# Patient Record
Sex: Male | Born: 1965 | Race: White | Hispanic: No | Marital: Single | Smoking: Former smoker
Health system: Southern US, Community
[De-identification: ages and names within clinical notes are randomized; demographics above are authoritative.]

## PROBLEM LIST (undated history)

## (undated) DIAGNOSIS — G4730 Sleep apnea, unspecified: Secondary | ICD-10-CM

## (undated) DIAGNOSIS — R748 Abnormal levels of other serum enzymes: Secondary | ICD-10-CM

## (undated) DIAGNOSIS — Z860100 Personal history of colon polyps, unspecified: Secondary | ICD-10-CM

## (undated) DIAGNOSIS — J302 Other seasonal allergic rhinitis: Secondary | ICD-10-CM

## (undated) DIAGNOSIS — Z87442 Personal history of urinary calculi: Secondary | ICD-10-CM

## (undated) DIAGNOSIS — K219 Gastro-esophageal reflux disease without esophagitis: Secondary | ICD-10-CM

## (undated) DIAGNOSIS — E785 Hyperlipidemia, unspecified: Secondary | ICD-10-CM

## (undated) DIAGNOSIS — G8929 Other chronic pain: Secondary | ICD-10-CM

## (undated) DIAGNOSIS — N529 Male erectile dysfunction, unspecified: Secondary | ICD-10-CM

## (undated) DIAGNOSIS — T7840XA Allergy, unspecified, initial encounter: Secondary | ICD-10-CM

## (undated) DIAGNOSIS — M109 Gout, unspecified: Secondary | ICD-10-CM

## (undated) DIAGNOSIS — K760 Fatty (change of) liver, not elsewhere classified: Secondary | ICD-10-CM

## (undated) DIAGNOSIS — F419 Anxiety disorder, unspecified: Secondary | ICD-10-CM

## (undated) DIAGNOSIS — I10 Essential (primary) hypertension: Secondary | ICD-10-CM

## (undated) DIAGNOSIS — R519 Headache, unspecified: Secondary | ICD-10-CM

## (undated) DIAGNOSIS — R51 Headache: Secondary | ICD-10-CM

## (undated) DIAGNOSIS — K5792 Diverticulitis of intestine, part unspecified, without perforation or abscess without bleeding: Secondary | ICD-10-CM

## (undated) HISTORY — PX: COLONOSCOPY: SHX174

## (undated) HISTORY — DX: Allergy, unspecified, initial encounter: T78.40XA

## (undated) HISTORY — PX: POLYPECTOMY: SHX149

## (undated) HISTORY — DX: Abnormal levels of other serum enzymes: R74.8

## (undated) HISTORY — DX: Personal history of colonic polyps: Z86.010

## (undated) HISTORY — DX: Male erectile dysfunction, unspecified: N52.9

## (undated) HISTORY — DX: Essential (primary) hypertension: I10

## (undated) HISTORY — DX: Fatty (change of) liver, not elsewhere classified: K76.0

## (undated) HISTORY — DX: Headache: R51

## (undated) HISTORY — DX: Headache, unspecified: R51.9

## (undated) HISTORY — DX: Anxiety disorder, unspecified: F41.9

## (undated) HISTORY — DX: Personal history of colon polyps, unspecified: Z86.0100

## (undated) HISTORY — DX: Other chronic pain: G89.29

## (undated) HISTORY — DX: Other seasonal allergic rhinitis: J30.2

## (undated) HISTORY — PX: HERNIA REPAIR: SHX51

## (undated) HISTORY — PX: OTHER SURGICAL HISTORY: SHX169

## (undated) HISTORY — DX: Hyperlipidemia, unspecified: E78.5

## (undated) HISTORY — DX: Gastro-esophageal reflux disease without esophagitis: K21.9

## (undated) HISTORY — DX: Gout, unspecified: M10.9

## (undated) HISTORY — DX: Sleep apnea, unspecified: G47.30

## (undated) HISTORY — PX: UPPER GASTROINTESTINAL ENDOSCOPY: SHX188

## (undated) HISTORY — DX: Diverticulitis of intestine, part unspecified, without perforation or abscess without bleeding: K57.92

---

## 2012-04-11 ENCOUNTER — Ambulatory Visit (INDEPENDENT_AMBULATORY_CARE_PROVIDER_SITE_OTHER): Payer: 59 | Admitting: Licensed Clinical Social Worker

## 2012-04-11 DIAGNOSIS — F39 Unspecified mood [affective] disorder: Secondary | ICD-10-CM

## 2012-04-18 ENCOUNTER — Ambulatory Visit (INDEPENDENT_AMBULATORY_CARE_PROVIDER_SITE_OTHER): Payer: 59 | Admitting: Licensed Clinical Social Worker

## 2012-04-18 DIAGNOSIS — F39 Unspecified mood [affective] disorder: Secondary | ICD-10-CM

## 2012-04-25 ENCOUNTER — Ambulatory Visit (INDEPENDENT_AMBULATORY_CARE_PROVIDER_SITE_OTHER): Payer: 59 | Admitting: Licensed Clinical Social Worker

## 2012-04-25 DIAGNOSIS — F39 Unspecified mood [affective] disorder: Secondary | ICD-10-CM

## 2012-04-29 ENCOUNTER — Ambulatory Visit: Payer: 59 | Admitting: Licensed Clinical Social Worker

## 2012-05-11 ENCOUNTER — Ambulatory Visit: Payer: 59 | Admitting: Licensed Clinical Social Worker

## 2012-09-21 ENCOUNTER — Ambulatory Visit (INDEPENDENT_AMBULATORY_CARE_PROVIDER_SITE_OTHER): Payer: 59 | Admitting: Licensed Clinical Social Worker

## 2012-09-21 DIAGNOSIS — F39 Unspecified mood [affective] disorder: Secondary | ICD-10-CM

## 2012-10-05 ENCOUNTER — Ambulatory Visit (INDEPENDENT_AMBULATORY_CARE_PROVIDER_SITE_OTHER): Payer: 59 | Admitting: Licensed Clinical Social Worker

## 2012-10-05 DIAGNOSIS — F39 Unspecified mood [affective] disorder: Secondary | ICD-10-CM

## 2012-10-17 ENCOUNTER — Ambulatory Visit (INDEPENDENT_AMBULATORY_CARE_PROVIDER_SITE_OTHER): Payer: 59 | Admitting: Licensed Clinical Social Worker

## 2012-10-17 DIAGNOSIS — F39 Unspecified mood [affective] disorder: Secondary | ICD-10-CM

## 2012-11-02 ENCOUNTER — Ambulatory Visit (INDEPENDENT_AMBULATORY_CARE_PROVIDER_SITE_OTHER): Payer: 59 | Admitting: Licensed Clinical Social Worker

## 2012-11-02 DIAGNOSIS — F39 Unspecified mood [affective] disorder: Secondary | ICD-10-CM

## 2012-11-14 ENCOUNTER — Ambulatory Visit (INDEPENDENT_AMBULATORY_CARE_PROVIDER_SITE_OTHER): Payer: 59 | Admitting: Licensed Clinical Social Worker

## 2012-11-14 DIAGNOSIS — F39 Unspecified mood [affective] disorder: Secondary | ICD-10-CM

## 2012-12-05 ENCOUNTER — Ambulatory Visit (INDEPENDENT_AMBULATORY_CARE_PROVIDER_SITE_OTHER): Payer: 59 | Admitting: Licensed Clinical Social Worker

## 2012-12-05 DIAGNOSIS — F39 Unspecified mood [affective] disorder: Secondary | ICD-10-CM

## 2012-12-19 ENCOUNTER — Ambulatory Visit (INDEPENDENT_AMBULATORY_CARE_PROVIDER_SITE_OTHER): Payer: 59 | Admitting: Licensed Clinical Social Worker

## 2012-12-19 DIAGNOSIS — F39 Unspecified mood [affective] disorder: Secondary | ICD-10-CM

## 2013-01-04 ENCOUNTER — Ambulatory Visit (INDEPENDENT_AMBULATORY_CARE_PROVIDER_SITE_OTHER): Payer: 59 | Admitting: Licensed Clinical Social Worker

## 2013-01-04 DIAGNOSIS — F39 Unspecified mood [affective] disorder: Secondary | ICD-10-CM

## 2013-01-25 ENCOUNTER — Ambulatory Visit (INDEPENDENT_AMBULATORY_CARE_PROVIDER_SITE_OTHER): Payer: 59 | Admitting: Licensed Clinical Social Worker

## 2013-01-25 DIAGNOSIS — F39 Unspecified mood [affective] disorder: Secondary | ICD-10-CM

## 2013-06-01 DIAGNOSIS — N529 Male erectile dysfunction, unspecified: Secondary | ICD-10-CM | POA: Insufficient documentation

## 2013-06-01 DIAGNOSIS — I10 Essential (primary) hypertension: Secondary | ICD-10-CM | POA: Insufficient documentation

## 2013-06-01 DIAGNOSIS — E782 Mixed hyperlipidemia: Secondary | ICD-10-CM | POA: Insufficient documentation

## 2013-06-01 DIAGNOSIS — R519 Headache, unspecified: Secondary | ICD-10-CM | POA: Insufficient documentation

## 2013-06-01 DIAGNOSIS — F4322 Adjustment disorder with anxiety: Secondary | ICD-10-CM | POA: Insufficient documentation

## 2013-12-11 ENCOUNTER — Ambulatory Visit (INDEPENDENT_AMBULATORY_CARE_PROVIDER_SITE_OTHER): Payer: 59 | Admitting: Licensed Clinical Social Worker

## 2013-12-11 DIAGNOSIS — F39 Unspecified mood [affective] disorder: Secondary | ICD-10-CM

## 2013-12-13 DIAGNOSIS — J302 Other seasonal allergic rhinitis: Secondary | ICD-10-CM | POA: Insufficient documentation

## 2013-12-27 ENCOUNTER — Ambulatory Visit (INDEPENDENT_AMBULATORY_CARE_PROVIDER_SITE_OTHER): Payer: 59 | Admitting: Licensed Clinical Social Worker

## 2013-12-27 DIAGNOSIS — F329 Major depressive disorder, single episode, unspecified: Secondary | ICD-10-CM

## 2014-01-12 ENCOUNTER — Ambulatory Visit: Payer: 59 | Admitting: Licensed Clinical Social Worker

## 2014-01-17 ENCOUNTER — Ambulatory Visit (INDEPENDENT_AMBULATORY_CARE_PROVIDER_SITE_OTHER): Payer: 59 | Admitting: Licensed Clinical Social Worker

## 2014-01-17 DIAGNOSIS — F329 Major depressive disorder, single episode, unspecified: Secondary | ICD-10-CM

## 2014-01-18 ENCOUNTER — Ambulatory Visit: Payer: 59 | Admitting: Licensed Clinical Social Worker

## 2014-02-09 ENCOUNTER — Ambulatory Visit (INDEPENDENT_AMBULATORY_CARE_PROVIDER_SITE_OTHER): Payer: 59 | Admitting: Licensed Clinical Social Worker

## 2014-02-09 DIAGNOSIS — F329 Major depressive disorder, single episode, unspecified: Secondary | ICD-10-CM

## 2014-03-23 HISTORY — PX: COLONOSCOPY: SHX5424

## 2014-04-11 ENCOUNTER — Ambulatory Visit (INDEPENDENT_AMBULATORY_CARE_PROVIDER_SITE_OTHER): Payer: 59 | Admitting: Licensed Clinical Social Worker

## 2014-04-11 DIAGNOSIS — F329 Major depressive disorder, single episode, unspecified: Secondary | ICD-10-CM

## 2014-04-25 ENCOUNTER — Ambulatory Visit: Payer: 59 | Admitting: Licensed Clinical Social Worker

## 2014-05-11 ENCOUNTER — Ambulatory Visit (INDEPENDENT_AMBULATORY_CARE_PROVIDER_SITE_OTHER): Payer: 59 | Admitting: Licensed Clinical Social Worker

## 2014-05-11 DIAGNOSIS — F329 Major depressive disorder, single episode, unspecified: Secondary | ICD-10-CM

## 2014-05-30 ENCOUNTER — Ambulatory Visit: Payer: 59 | Admitting: Licensed Clinical Social Worker

## 2014-06-15 ENCOUNTER — Ambulatory Visit: Payer: 59 | Admitting: Licensed Clinical Social Worker

## 2014-06-25 ENCOUNTER — Ambulatory Visit (INDEPENDENT_AMBULATORY_CARE_PROVIDER_SITE_OTHER): Payer: 59 | Admitting: Licensed Clinical Social Worker

## 2014-06-25 DIAGNOSIS — F329 Major depressive disorder, single episode, unspecified: Secondary | ICD-10-CM | POA: Diagnosis not present

## 2014-07-09 ENCOUNTER — Ambulatory Visit (INDEPENDENT_AMBULATORY_CARE_PROVIDER_SITE_OTHER): Payer: 59 | Admitting: Licensed Clinical Social Worker

## 2014-07-09 DIAGNOSIS — F329 Major depressive disorder, single episode, unspecified: Secondary | ICD-10-CM

## 2014-07-11 ENCOUNTER — Ambulatory Visit: Payer: 59 | Admitting: Licensed Clinical Social Worker

## 2014-07-23 ENCOUNTER — Ambulatory Visit: Payer: 59 | Admitting: Licensed Clinical Social Worker

## 2014-09-12 ENCOUNTER — Ambulatory Visit (INDEPENDENT_AMBULATORY_CARE_PROVIDER_SITE_OTHER): Payer: 59 | Admitting: Licensed Clinical Social Worker

## 2014-09-12 DIAGNOSIS — F329 Major depressive disorder, single episode, unspecified: Secondary | ICD-10-CM

## 2014-10-10 ENCOUNTER — Ambulatory Visit (INDEPENDENT_AMBULATORY_CARE_PROVIDER_SITE_OTHER): Payer: 59 | Admitting: Licensed Clinical Social Worker

## 2014-10-10 DIAGNOSIS — F329 Major depressive disorder, single episode, unspecified: Secondary | ICD-10-CM | POA: Diagnosis not present

## 2014-10-26 ENCOUNTER — Ambulatory Visit: Payer: 59 | Admitting: Licensed Clinical Social Worker

## 2014-11-21 ENCOUNTER — Ambulatory Visit (INDEPENDENT_AMBULATORY_CARE_PROVIDER_SITE_OTHER): Payer: 59 | Admitting: Licensed Clinical Social Worker

## 2014-11-21 DIAGNOSIS — F329 Major depressive disorder, single episode, unspecified: Secondary | ICD-10-CM

## 2014-12-19 ENCOUNTER — Ambulatory Visit (INDEPENDENT_AMBULATORY_CARE_PROVIDER_SITE_OTHER): Payer: 59 | Admitting: Licensed Clinical Social Worker

## 2014-12-19 DIAGNOSIS — F329 Major depressive disorder, single episode, unspecified: Secondary | ICD-10-CM | POA: Diagnosis not present

## 2014-12-24 DIAGNOSIS — I872 Venous insufficiency (chronic) (peripheral): Secondary | ICD-10-CM | POA: Insufficient documentation

## 2014-12-27 DIAGNOSIS — R748 Abnormal levels of other serum enzymes: Secondary | ICD-10-CM | POA: Insufficient documentation

## 2015-01-14 ENCOUNTER — Ambulatory Visit (INDEPENDENT_AMBULATORY_CARE_PROVIDER_SITE_OTHER): Payer: 59 | Admitting: Licensed Clinical Social Worker

## 2015-01-14 DIAGNOSIS — F329 Major depressive disorder, single episode, unspecified: Secondary | ICD-10-CM

## 2015-06-17 DIAGNOSIS — G479 Sleep disorder, unspecified: Secondary | ICD-10-CM | POA: Insufficient documentation

## 2015-06-17 DIAGNOSIS — R454 Irritability and anger: Secondary | ICD-10-CM | POA: Insufficient documentation

## 2015-07-01 ENCOUNTER — Ambulatory Visit (INDEPENDENT_AMBULATORY_CARE_PROVIDER_SITE_OTHER): Payer: 59 | Admitting: Licensed Clinical Social Worker

## 2015-07-01 DIAGNOSIS — F329 Major depressive disorder, single episode, unspecified: Secondary | ICD-10-CM

## 2015-07-19 ENCOUNTER — Ambulatory Visit (INDEPENDENT_AMBULATORY_CARE_PROVIDER_SITE_OTHER): Payer: 59 | Admitting: Licensed Clinical Social Worker

## 2015-08-14 ENCOUNTER — Ambulatory Visit: Payer: 59 | Admitting: Licensed Clinical Social Worker

## 2015-08-20 ENCOUNTER — Ambulatory Visit (INDEPENDENT_AMBULATORY_CARE_PROVIDER_SITE_OTHER): Payer: 59 | Admitting: Licensed Clinical Social Worker

## 2015-08-20 DIAGNOSIS — F329 Major depressive disorder, single episode, unspecified: Secondary | ICD-10-CM | POA: Diagnosis not present

## 2015-12-04 ENCOUNTER — Encounter: Payer: Self-pay | Admitting: Adult Health

## 2015-12-04 ENCOUNTER — Ambulatory Visit (INDEPENDENT_AMBULATORY_CARE_PROVIDER_SITE_OTHER): Payer: Self-pay | Admitting: Adult Health

## 2015-12-04 VITALS — BP 124/62 | Temp 98.6°F | Wt 192.6 lb

## 2015-12-04 DIAGNOSIS — Z7189 Other specified counseling: Secondary | ICD-10-CM | POA: Diagnosis not present

## 2015-12-04 DIAGNOSIS — F411 Generalized anxiety disorder: Secondary | ICD-10-CM | POA: Diagnosis not present

## 2015-12-04 DIAGNOSIS — Z7689 Persons encountering health services in other specified circumstances: Secondary | ICD-10-CM

## 2015-12-04 DIAGNOSIS — Z23 Encounter for immunization: Secondary | ICD-10-CM

## 2015-12-04 DIAGNOSIS — I10 Essential (primary) hypertension: Secondary | ICD-10-CM

## 2015-12-04 NOTE — Progress Notes (Signed)
Patient presents to clinic today to establish care. He is a pleasant 50 year old male who  has a past medical history of ED (erectile dysfunction); Elevated liver enzymes; Hyperlipidemia; Hypertension; and Seasonal allergies.  His last physical was in March 2017   Acute Concerns: Establish Care   Chronic Issues: Hyperlipidemia  - Takes fish oil and feels as though this controls his cholesterol levels   Hypertension  - He feels as though this is well controlled on his current medication   Anxiety/Depression - Has been dealing with this for the last few years. He was placed on Remeron by his previous PCP to help with this as well as headaches and sleep issues. He feels as though the depression, anxiety, insomnia, and headaches have improved since being on remeron.   Health Maintenance: Dental -- Routine Care Vision -- Routine Care  Immunizations -- UTD  Colonoscopy -- 2015 - Negative - 10 year plan Diet: Has been eating healthier since this sping. Does not eat fast food or red meat often Exercise: He has been doing aerobic exercise since March and feels as though this is made a difference in his overall health   Past Medical History:  Diagnosis Date  . ED (erectile dysfunction)   . Elevated liver enzymes   . Hyperlipidemia   . Hypertension   . Seasonal allergies     No past surgical history on file.  No current outpatient prescriptions on file prior to visit.   No current facility-administered medications on file prior to visit.     Allergies not on file  Family History  Problem Relation Age of Onset  . Breast cancer Mother   . Lung cancer Father   . Prostate cancer Brother 70  . Cancer Paternal Grandfather     Social History   Social History  . Marital status: Unknown    Spouse name: N/A  . Number of children: N/A  . Years of education: N/A   Occupational History  . Not on file.   Social History Main Topics  . Smoking status: Former Research scientist (life sciences)  .  Smokeless tobacco: Never Used  . Alcohol use Not on file     Comment: "socially"  . Drug use: No  . Sexual activity: Not on file   Other Topics Concern  . Not on file   Social History Narrative  . No narrative on file    Review of Systems  Constitutional: Negative.   HENT: Negative.   Eyes: Negative.   Respiratory: Negative.   Cardiovascular: Negative.   Gastrointestinal: Negative.   Genitourinary: Negative.   Musculoskeletal: Negative.   Skin: Negative.   Neurological: Negative.   Endo/Heme/Allergies: Negative.   Psychiatric/Behavioral: Negative.   All other systems reviewed and are negative.   BP 124/62   Temp 98.6 F (37 C) (Oral)   Wt 192 lb 9.6 oz (87.4 kg)   Physical Exam  Constitutional: He is oriented to person, place, and time and well-developed, well-nourished, and in no distress. No distress.  HENT:  Head: Normocephalic and atraumatic.  Right Ear: External ear normal.  Left Ear: External ear normal.  Nose: Nose normal.  Mouth/Throat: Oropharynx is clear and moist. No oropharyngeal exudate.  Cardiovascular: Normal rate, regular rhythm, normal heart sounds and intact distal pulses.  Exam reveals no friction rub.   No murmur heard. Pulmonary/Chest: Effort normal and breath sounds normal. No respiratory distress. He has no wheezes. He has no rales. He exhibits no tenderness.  Musculoskeletal: Normal  range of motion. He exhibits no edema, tenderness or deformity.  Neurological: He is alert and oriented to person, place, and time. Gait normal. GCS score is 15.  Skin: Skin is warm and dry. No rash noted. He is not diaphoretic. No erythema. No pallor.  Psychiatric: Mood, memory, affect and judgment normal.  Nursing note and vitals reviewed.   Assessment/Plan:  1. Encounter to establish care - Follow up for CPE - Follow up sooner if needed - Encouraged heart healthy diet and frequent aerobic exercise   2. Need for influenza vaccination - Flu Vaccine QUAD  36+ mos PF IM (Fluarix & Fluzone Quad PF)  3. Essential hypertension - Near goal.  - No change in medication at this time. He would like more time to work on diet and exercise before adding medication - Will follow up with at next visit.   4. Generalized anxiety disorder - Seems to be well controlled with Remeron. No change in medication at this time  [name], NP

## 2015-12-04 NOTE — Patient Instructions (Signed)
It was great meeting you today!  Please follow up with me in March 2018 for your next physical. If you need anything in the meantime, please let me know.   Continue to work on diet and exercise  Health Maintenance, Male A healthy lifestyle and preventative care can promote health and wellness.  Maintain regular health, dental, and eye exams.  Eat a healthy diet. Foods like vegetables, fruits, whole grains, low-fat dairy products, and lean protein foods contain the nutrients you need and are low in calories. Decrease your intake of foods high in solid fats, added sugars, and salt. Get information about a proper diet from your health care provider, if necessary.  Regular physical exercise is one of the most important things you can do for your health. Most adults should get at least 150 minutes of moderate-intensity exercise (any activity that increases your heart rate and causes you to sweat) each week. In addition, most adults need muscle-strengthening exercises on 2 or more days a week.   Maintain a healthy weight. The body mass index (BMI) is a screening tool to identify possible weight problems. It provides an estimate of body fat based on height and weight. Your health care provider can find your BMI and can help you achieve or maintain a healthy weight. For males 20 years and older:  A BMI below 18.5 is considered underweight.  A BMI of 18.5 to 24.9 is normal.  A BMI of 25 to 29.9 is considered overweight.  A BMI of 30 and above is considered obese.  Maintain normal blood lipids and cholesterol by exercising and minimizing your intake of saturated fat. Eat a balanced diet with plenty of fruits and vegetables. Blood tests for lipids and cholesterol should begin at age 31 and be repeated every 5 years. If your lipid or cholesterol levels are high, you are over age 53, or you are at high risk for heart disease, you may need your cholesterol levels checked more frequently.Ongoing high  lipid and cholesterol levels should be treated with medicines if diet and exercise are not working.  If you smoke, find out from your health care provider how to quit. If you do not use tobacco, do not start.  Lung cancer screening is recommended for adults aged 36-80 years who are at high risk for developing lung cancer because of a history of smoking. A yearly low-dose CT scan of the lungs is recommended for people who have at least a 30-pack-year history of smoking and are current smokers or have quit within the past 15 years. A pack year of smoking is smoking an average of 1 pack of cigarettes a day for 1 year (for example, a 30-pack-year history of smoking could mean smoking 1 pack a day for 30 years or 2 packs a day for 15 years). Yearly screening should continue until the smoker has stopped smoking for at least 15 years. Yearly screening should be stopped for people who develop a health problem that would prevent them from having lung cancer treatment.  If you choose to drink alcohol, do not have more than 2 drinks per day. One drink is considered to be 12 oz (360 mL) of beer, 5 oz (150 mL) of wine, or 1.5 oz (45 mL) of liquor.  Avoid the use of street drugs. Do not share needles with anyone. Ask for help if you need support or instructions about stopping the use of drugs.  High blood pressure causes heart disease and increases the risk of  stroke. High blood pressure is more likely to develop in:  People who have blood pressure in the end of the normal range (100-139/85-89 mm Hg).  People who are overweight or obese.  People who are African American.  If you are 3-41 years of age, have your blood pressure checked every 3-5 years. If you are 28 years of age or older, have your blood pressure checked every year. You should have your blood pressure measured twice--once when you are at a hospital or clinic, and once when you are not at a hospital or clinic. Record the average of the two  measurements. To check your blood pressure when you are not at a hospital or clinic, you can use:  An automated blood pressure machine at a pharmacy.  A home blood pressure monitor.  If you are 39-74 years old, ask your health care provider if you should take aspirin to prevent heart disease.  Diabetes screening involves taking a blood sample to check your fasting blood sugar level. This should be done once every 3 years after age 73 if you are at a normal weight and without risk factors for diabetes. Testing should be considered at a younger age or be carried out more frequently if you are overweight and have at least 1 risk factor for diabetes.  Colorectal cancer can be detected and often prevented. Most routine colorectal cancer screening begins at the age of 41 and continues through age 60. However, your health care provider may recommend screening at an earlier age if you have risk factors for colon cancer. On a yearly basis, your health care provider may provide home test kits to check for hidden blood in the stool. A small camera at the end of a tube may be used to directly examine the colon (sigmoidoscopy or colonoscopy) to detect the earliest forms of colorectal cancer. Talk to your health care provider about this at age 31 when routine screening begins. A direct exam of the colon should be repeated every 5-10 years through age 46, unless early forms of precancerous polyps or small growths are found.  People who are at an increased risk for hepatitis B should be screened for this virus. You are considered at high risk for hepatitis B if:  You were born in a country where hepatitis B occurs often. Talk with your health care provider about which countries are considered high risk.  Your parents were born in a high-risk country and you have not received a shot to protect against hepatitis B (hepatitis B vaccine).  You have HIV or AIDS.  You use needles to inject street drugs.  You live  with, or have sex with, someone who has hepatitis B.  You are a man who has sex with other men (MSM).  You get hemodialysis treatment.  You take certain medicines for conditions like cancer, organ transplantation, and autoimmune conditions.  Hepatitis C blood testing is recommended for all people born from [birth year] through 1965 and any individual with known risk factors for hepatitis C.  Healthy men should no longer receive prostate-specific antigen (PSA) blood tests as part of routine cancer screening. Talk to your health care provider about prostate cancer screening.  Testicular cancer screening is not recommended for adolescents or adult males who have no symptoms. Screening includes self-exam, a health care provider exam, and other screening tests. Consult with your health care provider about any symptoms you have or any concerns you have about testicular cancer.  Practice safe sex. Use  condoms and avoid high-risk sexual practices to reduce the spread of sexually transmitted infections (STIs).  You should be screened for STIs, including gonorrhea and chlamydia if:  You are sexually active and are younger than 24 years.  You are older than 24 years, and your health care provider tells you that you are at risk for this type of infection.  Your sexual activity has changed since you were last screened, and you are at an increased risk for chlamydia or gonorrhea. Ask your health care provider if you are at risk.  If you are at risk of being infected with HIV, it is recommended that you take a prescription medicine daily to prevent HIV infection. This is called pre-exposure prophylaxis (PrEP). You are considered at risk if:  You are a man who has sex with other men (MSM).  You are a heterosexual man who is sexually active with multiple partners.  You take drugs by injection.  You are sexually active with a partner who has HIV.  Talk with your health care provider about whether you are at  high risk of being infected with HIV. If you choose to begin PrEP, you should first be tested for HIV. You should then be tested every 3 months for as long as you are taking PrEP.  Use sunscreen. Apply sunscreen liberally and repeatedly throughout the day. You should seek shade when your shadow is shorter than you. Protect yourself by wearing long sleeves, pants, a wide-brimmed hat, and sunglasses year round whenever you are outdoors.  Tell your health care provider of new moles or changes in moles, especially if there is a change in shape or color. Also, tell your health care provider if a mole is larger than the size of a pencil eraser.  A one-time screening for abdominal aortic aneurysm (AAA) and surgical repair of large AAAs by ultrasound is recommended for men aged 83-75 years who are current or former smokers.  Stay current with your vaccines (immunizations).   This information is not intended to replace advice given to you by your health care provider. Make sure you discuss any questions you have with your health care provider.   Document Released: 09/05/2007 Document Revised: 03/30/2014 Document Reviewed: 08/04/2010 Elsevier Interactive Patient Education [employer].

## 2016-03-17 ENCOUNTER — Other Ambulatory Visit: Payer: Self-pay | Admitting: Adult Health

## 2016-03-17 ENCOUNTER — Other Ambulatory Visit: Payer: Self-pay | Admitting: Emergency Medicine

## 2016-03-17 MED ORDER — AMLODIPINE BESY-BENAZEPRIL HCL 5-20 MG PO CAPS: 1.0000 | ORAL_CAPSULE | Freq: Every day | ORAL | 0 refills | Status: DC

## 2016-03-17 NOTE — Telephone Encounter (Signed)
Pt need new Rx for amlodipine-benazepril    Pharm:  [place]

## 2016-03-17 NOTE — Telephone Encounter (Signed)
Rx sent 

## 2016-03-20 ENCOUNTER — Telehealth: Payer: Self-pay | Admitting: Adult Health

## 2016-03-20 ENCOUNTER — Other Ambulatory Visit: Payer: Self-pay

## 2016-03-20 MED ORDER — AMLODIPINE BESY-BENAZEPRIL HCL 5-20 MG PO CAPS: 1.0000 | ORAL_CAPSULE | Freq: Every day | ORAL | 0 refills | Status: DC

## 2016-03-20 NOTE — Telephone Encounter (Signed)
Rx has been sent in. 

## 2016-03-20 NOTE — Telephone Encounter (Signed)
Pt would like to see if you could retract the Rx for amlodipine that was sent to [facility] and sent to [facility] in [place] due to the pharmacy will not be open this weekend and he needs to get it.

## 2016-03-26 ENCOUNTER — Other Ambulatory Visit: Payer: Self-pay

## 2016-03-26 MED ORDER — MIRTAZAPINE 7.5 MG PO TABS: 7.5000 mg | ORAL_TABLET | Freq: Every day | ORAL | 1 refills | Status: DC

## 2016-05-21 ENCOUNTER — Other Ambulatory Visit (INDEPENDENT_AMBULATORY_CARE_PROVIDER_SITE_OTHER): Payer: 59

## 2016-05-21 ENCOUNTER — Other Ambulatory Visit: Payer: Self-pay | Admitting: Adult Health

## 2016-05-21 DIAGNOSIS — Z0000 Encounter for general adult medical examination without abnormal findings: Secondary | ICD-10-CM | POA: Diagnosis not present

## 2016-05-21 LAB — HEPATIC FUNCTION PANEL
ALT: 41 U/L (ref 0–53)
AST: 30 U/L (ref 0–37)
Albumin: 4.5 g/dL (ref 3.5–5.2)
Alkaline Phosphatase: 62 U/L (ref 39–117)
Bilirubin, Direct: 0.1 mg/dL (ref 0.0–0.3)
Total Bilirubin: 0.6 mg/dL (ref 0.2–1.2)
Total Protein: 6.7 g/dL (ref 6.0–8.3)

## 2016-05-21 LAB — CBC WITH DIFFERENTIAL/PLATELET
Basophils Absolute: 0 10*3/uL (ref 0.0–0.1)
Basophils Relative: 0.8 % (ref 0.0–3.0)
Eosinophils Absolute: 0.1 10*3/uL (ref 0.0–0.7)
Eosinophils Relative: 2.8 % (ref 0.0–5.0)
HCT: 47.6 % (ref 39.0–52.0)
Hemoglobin: 16.5 g/dL (ref 13.0–17.0)
Lymphocytes Relative: 39 % (ref 12.0–46.0)
Lymphs Abs: 1.2 10*3/uL (ref 0.7–4.0)
MCHC: 34.5 g/dL (ref 30.0–36.0)
MCV: 98.8 fl (ref 78.0–100.0)
Monocytes Absolute: 0.4 10*3/uL (ref 0.1–1.0)
Monocytes Relative: 12.1 % — ABNORMAL HIGH (ref 3.0–12.0)
Neutro Abs: 1.4 10*3/uL (ref 1.4–7.7)
Neutrophils Relative %: 45.3 % (ref 43.0–77.0)
Platelets: 142 10*3/uL — ABNORMAL LOW (ref 150.0–400.0)
RBC: 4.82 Mil/uL (ref 4.22–5.81)
RDW: 13.1 % (ref 11.5–15.5)
WBC: 3.2 10*3/uL — ABNORMAL LOW (ref 4.0–10.5)

## 2016-05-21 LAB — LIPID PANEL
Cholesterol: 227 mg/dL — ABNORMAL HIGH (ref 0–200)
HDL: 69.7 mg/dL (ref >39.00)
LDL Cholesterol: 134 mg/dL — ABNORMAL HIGH (ref 0–99)
NonHDL: 156.98
Total CHOL/HDL Ratio: 3
Triglycerides: 113 mg/dL (ref 0.0–149.0)
VLDL: 22.6 mg/dL (ref 0.0–40.0)

## 2016-05-21 LAB — POC URINALSYSI DIPSTICK (AUTOMATED)
Bilirubin, UA: NEGATIVE
Blood, UA: NEGATIVE
Glucose, UA: NEGATIVE
Ketones, UA: NEGATIVE
Leukocytes, UA: NEGATIVE
Nitrite, UA: NEGATIVE
Protein, UA: NEGATIVE
Spec Grav, UA: 1.025
Urobilinogen, UA: 0.2
pH, UA: 5.5

## 2016-05-21 LAB — BASIC METABOLIC PANEL
BUN: 15 mg/dL (ref 6–23)
CO2: 26 mEq/L (ref 19–32)
Calcium: 9.3 mg/dL (ref 8.4–10.5)
Chloride: 106 mEq/L (ref 96–112)
Creatinine, Ser: 1.16 mg/dL (ref 0.40–1.50)
GFR: 70.08 mL/min (ref >60.00)
Glucose, Bld: 89 mg/dL (ref 70–99)
Potassium: 4.1 mEq/L (ref 3.5–5.1)
Sodium: 140 mEq/L (ref 135–145)

## 2016-05-21 LAB — PSA: PSA: 0.58 ng/mL (ref 0.10–4.00)

## 2016-05-21 LAB — TSH: TSH: 1.95 u[IU]/mL (ref 0.35–4.50)

## 2016-05-21 MED ORDER — OMEGA-3-ACID ETHYL ESTERS 1 G PO CAPS: 1.0000 g | ORAL_CAPSULE | Freq: Three times a day (TID) | ORAL | 3 refills | Status: DC

## 2016-05-26 ENCOUNTER — Encounter: Payer: Self-pay | Admitting: Adult Health

## 2016-05-26 ENCOUNTER — Ambulatory Visit (INDEPENDENT_AMBULATORY_CARE_PROVIDER_SITE_OTHER): Payer: 59 | Admitting: Adult Health

## 2016-05-26 VITALS — BP 148/92 | Temp 98.6°F | Wt 194.3 lb

## 2016-05-26 DIAGNOSIS — I10 Essential (primary) hypertension: Secondary | ICD-10-CM

## 2016-05-26 DIAGNOSIS — E782 Mixed hyperlipidemia: Secondary | ICD-10-CM | POA: Diagnosis not present

## 2016-05-26 DIAGNOSIS — Z0000 Encounter for general adult medical examination without abnormal findings: Secondary | ICD-10-CM

## 2016-05-26 DIAGNOSIS — F4322 Adjustment disorder with anxiety: Secondary | ICD-10-CM

## 2016-05-26 MED ORDER — FLUTICASONE PROPIONATE 50 MCG/ACT NA SUSP: 16.0000 | Freq: Every day | NASAL | 3 refills | Status: DC

## 2016-05-26 MED ORDER — AMLODIPINE BESY-BENAZEPRIL HCL 5-20 MG PO CAPS: 1.0000 | ORAL_CAPSULE | Freq: Every day | ORAL | 3 refills | Status: DC

## 2016-05-26 MED ORDER — MIRTAZAPINE 7.5 MG PO TABS: 7.5000 mg | ORAL_TABLET | Freq: Every day | ORAL | 1 refills | Status: DC

## 2016-05-26 NOTE — Patient Instructions (Signed)
It was great seeing you today!   Please record your blood pressures for me over the next two weeks and send me the results.   If you need anything, please let me know

## 2016-05-26 NOTE — Progress Notes (Signed)
Subjective:    Patient ID: Raymond Johnson, male    DOB: 13-Mar-1966, 51 y.o.   MRN: [id number]  HPI  Patient presents for yearly preventative medicine examination. He is a pleasant 51 year old male who  has a past medical history of Anxiety; Chronic headaches; Depression; Diverticulitis; ED (erectile dysfunction); Elevated liver enzymes; Hyperlipidemia; Hypertension; Insomnia; and Seasonal allergies.  All immunizations and health maintenance protocols were reviewed with the patient and needed orders were placed.  Appropriate screening laboratory values were ordered for the patient including screening of hyperlipidemia, renal function and hepatic function.  Medication reconciliation,  past medical history, social history, problem list and allergies were reviewed in detail with the patient  Goals were established with regard to weight loss, exercise, and  diet in compliance with medications. He is working out and is eating healthy  He is up to date on his colonoscopy, dental and vision care.   Hyperlipidemia  - Takes fish oil and feels as though this controls his cholesterol levels   Hypertension  - He feels as though this is well controlled on his current medication   Anxiety/Depression - Controlled with Remeron   Review of Systems  Constitutional: Negative.   HENT: Negative.   Eyes: Negative.   Respiratory: Negative.   Cardiovascular: Negative.   Gastrointestinal: Negative.   Endocrine: Negative.   Genitourinary: Negative.   Musculoskeletal: Negative.   Skin: Negative.   Allergic/Immunologic: Negative.   Neurological: Negative.   Hematological: Negative.   Psychiatric/Behavioral: Negative.   All other systems reviewed and are negative.   Past Medical History:  Diagnosis Date  . Anxiety   . Chronic headaches   . Depression   . Diverticulitis   . ED (erectile dysfunction)   . Elevated liver enzymes   . Hyperlipidemia   . Hypertension   . Insomnia   .  Seasonal allergies     Social History   Social History  . Marital status: Unknown    Spouse name: N/A  . Number of children: N/A  . Years of education: N/A   Occupational History  . Not on file.   Social History Main Topics  . Smoking status: Former Research scientist (life sciences)  . Smokeless tobacco: Never Used  . Alcohol use Not on file     Comment: "socially"  . Drug use: No  . Sexual activity: Not on file   Other Topics Concern  . 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  Family History  Problem Relation Age of Onset  . Breast cancer Mother   . Lung cancer Father   . Prostate cancer Brother 44  . Cancer Paternal Grandfather     Not on File  Current Outpatient Prescriptions on File Prior to Visit  Medication Sig Dispense Refill  . omega-3 acid ethyl esters (LOVAZA) 1 g capsule Take 1 capsule (1 g total) by mouth 3 (three) times daily. 270 capsule 3  . tadalafil (CIALIS) 10 MG tablet Take 10 mg by mouth daily as needed for erectile dysfunction.     No current facility-administered medications on file prior to visit.     BP (!) 158/76   Temp 98.6 F (37 C) (Oral)   Wt  194 lb 4.8 oz (88.1 kg)        Objective:   Physical Exam  Constitutional: He is oriented to person, place, and time. He appears well-developed and well-nourished. No distress.  HENT:  Head: Normocephalic and atraumatic.  Right Ear: External ear normal.  Left Ear: External ear normal.  Nose: Nose normal.  Mouth/Throat: Oropharynx is clear and moist. No oropharyngeal exudate.  Eyes: Conjunctivae and EOM are normal. Pupils are equal, round, and reactive to light. Right eye exhibits no discharge. Left eye exhibits no discharge. No scleral icterus.  Neck: Normal range of motion. Neck supple. No JVD present. No tracheal deviation  present. No thyromegaly present.  Cardiovascular: Normal rate, regular rhythm, normal heart sounds and intact distal pulses.  Exam reveals no gallop and no friction rub.   No murmur heard. Pulmonary/Chest: Effort normal and breath sounds normal. No stridor. No respiratory distress. He has no wheezes. He has no rales. He exhibits no tenderness.  Abdominal: Soft. Bowel sounds are normal. He exhibits no distension and no mass. There is no tenderness. There is no rebound and no guarding.  Musculoskeletal: Normal range of motion. He exhibits no edema, tenderness or deformity.  Lymphadenopathy:    He has no cervical adenopathy.  Neurological: He is alert and oriented to person, place, and time. He has normal reflexes. No cranial nerve deficit. He exhibits normal muscle tone. Coordination normal.  Skin: Skin is warm and dry. No rash noted. He is not diaphoretic. No erythema. No pallor.  Psychiatric: He has a normal mood and affect. His behavior is normal. Judgment and thought content normal.  Nursing note and vitals reviewed.     Assessment & Plan:  1. Routine general medical examination at a health care facility - Reviewed labs in detail with patient, all questions answered.  - Follow up in one year or sooner if needed - Continue to diet and exercsie  2. Essential hypertension - Continue same dose for now. He is going to call me with readings at home over the next two weeks - Consider changing dose strength   3. Mixed hyperlipidemia . 4.2 % 10 year cardia risk. No change in medication at this time - Continue to diet and exercise  4. Adjustment disorder with anxious mood - Well controlled with Remeron   [name], NP

## 2016-05-31 ENCOUNTER — Encounter: Payer: Self-pay | Admitting: Adult Health

## 2016-06-25 DIAGNOSIS — I872 Venous insufficiency (chronic) (peripheral): Secondary | ICD-10-CM | POA: Diagnosis not present

## 2016-07-09 ENCOUNTER — Encounter: Payer: Self-pay | Admitting: Adult Health

## 2016-07-09 ENCOUNTER — Ambulatory Visit (INDEPENDENT_AMBULATORY_CARE_PROVIDER_SITE_OTHER): Payer: 59 | Admitting: Adult Health

## 2016-07-09 VITALS — BP 130/70 | Temp 98.8°F | Wt 196.0 lb

## 2016-07-09 DIAGNOSIS — S61217A Laceration without foreign body of left little finger without damage to nail, initial encounter: Secondary | ICD-10-CM

## 2016-07-09 NOTE — Progress Notes (Signed)
Subjective:    Patient ID: Raymond Johnson, male    DOB: Jun 29, 1965, 51 y.o.   MRN: [id number]  HPI  51 year old male who  has a past medical history of Anxiety; Chronic headaches; Depression; Diverticulitis; ED (erectile dysfunction); Elevated liver enzymes; Hyperlipidemia; Hypertension; Insomnia; and Seasonal allergies.   He presents to the office today for wound to left pinky finger. He reports last night he accidentally cut his left pinky finger on a broken wine glass. He reports that he had "extensive bleeding" that has since been controlled.   He would like to make sure that he does not need sutures.    Review of Systems See HPI   Past Medical History:  Diagnosis Date  . Anxiety   . Chronic headaches   . Depression   . Diverticulitis   . ED (erectile dysfunction)   . Elevated liver enzymes   . Hyperlipidemia   . Hypertension   . Insomnia   . Seasonal allergies     Social History   Social History  . Marital status: Unknown    Spouse name: N/A  . Number of children: N/A  . Years of education: N/A   Occupational History  . Not on file.   Social History Main Topics  . Smoking status: Former Research scientist (life sciences)  . Smokeless tobacco: Never Used  . Alcohol use Not on file     Comment: "socially"  . Drug use: No  . Sexual activity: Not on file   Other Topics Concern  . 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  Family History  Problem Relation Age of Onset  . Breast cancer Mother   . Lung cancer Father   . Prostate cancer Brother 49  . Cancer Paternal Grandfather     No Known Allergies  Current Outpatient Prescriptions on File Prior to Visit  Medication Sig Dispense Refill  . amLODipine-benazepril (LOTREL) 5-20 MG capsule Take 1 capsule by mouth daily. 90 capsule 3  .  fluticasone (FLONASE) 50 MCG/ACT nasal spray Place 16 sprays into both nostrils daily. (Patient taking differently: Place 1 spray into both nostrils daily. ) 16 g 3  . mirtazapine (REMERON) 7.5 MG tablet Take 1 tablet (7.5 mg total) by mouth at bedtime. 90 tablet 1  . omega-3 acid ethyl esters (LOVAZA) 1 g capsule Take 1 capsule (1 g total) by mouth 3 (three) times daily. 270 capsule 3  . tadalafil (CIALIS) 10 MG tablet Take 10 mg by mouth daily as needed for erectile dysfunction.     No current facility-administered medications on file prior to visit.     BP 130/70 (BP Location: Left Arm, Patient Position: Sitting, Cuff Size: Large)   Temp 98.8 F (37.1 C) (Oral)   Wt 196 lb (88.9 kg)       Objective:   Physical Exam  Constitutional: He is oriented to person, place, and time. He appears well-developed and well-nourished. No distress.  Neurological: He is alert and oriented to person, place, and time.  Skin: Skin is warm and dry. He is not diaphoretic.  Small laceration noted on left dorsal aspect of left pinky. Laceration is no larger than a pencil eraser. Slight active bleeding  after bandaid removed. No foreign body noted   Psychiatric: He has a normal mood and affect. His behavior is normal. Judgment and thought content normal.  Nursing note and vitals reviewed.     Assessment & Plan:  1. Laceration of left little finger without foreign body without damage to nail, initial encounter - He reports having he had a Tdap within the last 3 years  - Wound was irrigated with sterile saline solution - Triple antibiotic ointment applied with bandage - Follow up with any signs of infection   [name], NP

## 2016-08-28 ENCOUNTER — Encounter: Payer: Self-pay | Admitting: Adult Health

## 2016-08-28 ENCOUNTER — Other Ambulatory Visit: Payer: Self-pay | Admitting: Adult Health

## 2016-08-28 MED ORDER — TADALAFIL 10 MG PO TABS: 10.0000 mg | ORAL_TABLET | Freq: Every day | ORAL | 6 refills | Status: DC | PRN

## 2016-09-01 ENCOUNTER — Other Ambulatory Visit: Payer: Self-pay | Admitting: Adult Health

## 2016-09-29 ENCOUNTER — Encounter: Payer: Self-pay | Admitting: Adult Health

## 2016-10-20 ENCOUNTER — Encounter: Payer: Self-pay | Admitting: Adult Health

## 2016-10-20 ENCOUNTER — Ambulatory Visit (INDEPENDENT_AMBULATORY_CARE_PROVIDER_SITE_OTHER): Payer: 59 | Admitting: Adult Health

## 2016-10-20 VITALS — BP 158/70 | Temp 98.8°F | Wt 193.0 lb

## 2016-10-20 DIAGNOSIS — M79602 Pain in left arm: Secondary | ICD-10-CM

## 2016-10-20 NOTE — Progress Notes (Signed)
Subjective:    Patient ID: Raymond Johnson, male    DOB: 28-Aug-1965, 51 y.o.   MRN: [id number]  HPI  51 year old male who  has a past medical history of Anxiety; Chronic headaches; Depression; Diverticulitis; ED (erectile dysfunction); Elevated liver enzymes; Hyperlipidemia; Hypertension; Insomnia; and Seasonal allergies. He presents to the office today with the complaint of left forearm pain. He reports that three days ago he was picking up his elderly dog when he heard a "snap" in his left forearm. Since that time he has had severe pain, bruising and loss of ROM. He denies any loss of sensation in his fingers.   Denies any trauma.   He has been using motrin for pain relief - denies resolution in pain    Review of Systems See HPI   Past Medical History:  Diagnosis Date  . Anxiety   . Chronic headaches   . Depression   . Diverticulitis   . ED (erectile dysfunction)   . Elevated liver enzymes   . Hyperlipidemia   . Hypertension   . Insomnia   . Seasonal allergies     Social History   Social History  . Marital status: Unknown    Spouse name: N/A  . Number of children: N/A  . Years of education: N/A   Occupational History  . Not on file.   Social History Main Topics  . Smoking status: Former Research scientist (life sciences)  . Smokeless tobacco: Never Used  . Alcohol use Not on file     Comment: "socially"  . Drug use: No  . Sexual activity: Not on file   Other Topics Concern  . 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  Family History  Problem Relation Age of Onset  . Breast cancer Mother   . Lung cancer Father   . Prostate cancer Brother 12  . Cancer Paternal Grandfather     No Known Allergies  Current Outpatient Prescriptions on File Prior to Visit  Medication Sig Dispense Refill  .  amLODipine-benazepril (LOTREL) 5-20 MG capsule Take 1 capsule by mouth daily. 90 capsule 3  . fluticasone (FLONASE) 50 MCG/ACT nasal spray Place 16 sprays into both nostrils daily. (Patient taking differently: Place 1 spray into both nostrils daily. ) 16 g 3  . mirtazapine (REMERON) 7.5 MG tablet Take 1 tablet (7.5 mg total) by mouth at bedtime. 90 tablet 1  . omega-3 acid ethyl esters (LOVAZA) 1 g capsule Take 1 capsule (1 g total) by mouth 3 (three) times daily. 270 capsule 3  . tadalafil (CIALIS) 20 MG tablet Take 20 mg by mouth daily as needed.     No current facility-administered medications on file prior to visit.     BP (!) 158/70 (BP Location: Right Arm)   Temp 98.8 F (37.1 C) (Oral)   Wt 193 lb (87.5 kg)       Objective:   Physical Exam  Constitutional: He is oriented to person, place, and time. He appears well-developed and well-nourished. No distress.  Cardiovascular: Normal rate, regular rhythm, normal heart sounds and intact distal pulses.  Exam reveals no gallop and no friction rub.   No murmur heard. Pulmonary/Chest: Effort normal and breath  sounds normal. No respiratory distress. He has no wheezes. He has no rales. He exhibits no tenderness.  Musculoskeletal: He exhibits tenderness.  Unable to full straighten out left arm without pain. Able to make bicep without pain or difficulty.   Neurological: He is alert and oriented to person, place, and time.  Skin: Skin is warm and dry. No rash noted. He is not diaphoretic. No erythema. No pallor.  Large bruise noted on left inner forearm. Pain with palpation. Bruising in various stages of healing.   Psychiatric: He has a normal mood and affect. His behavior is normal. Judgment and thought content normal.  Nursing note and vitals reviewed.     Assessment & Plan:  1. Left arm pain - Will send to sports medicine for further evaluation due to symptoms. Possible ligament injury?  - Ambulatory referral to Sports Medicine - Ice  pack and motrin for pain relief  [name], NP

## 2016-10-21 ENCOUNTER — Encounter: Payer: Self-pay | Admitting: Adult Health

## 2016-10-21 DIAGNOSIS — M79632 Pain in left forearm: Secondary | ICD-10-CM | POA: Diagnosis not present

## 2016-10-21 NOTE — Telephone Encounter (Signed)
Patient called [facility] to be scheduled with Dr. Paulla Fore however, Dr. Paulla Fore is on vacation all this week. I offered to see what the availability looked like for next week once he returned however patient declined. Patient began to ask where else he should go and I advised that he needed to discuss the matter with his PCP and his PCP would advise.

## 2016-11-04 ENCOUNTER — Ambulatory Visit (INDEPENDENT_AMBULATORY_CARE_PROVIDER_SITE_OTHER): Payer: 59 | Admitting: Licensed Clinical Social Worker

## 2016-11-04 DIAGNOSIS — F329 Major depressive disorder, single episode, unspecified: Secondary | ICD-10-CM

## 2016-11-09 DIAGNOSIS — M79632 Pain in left forearm: Secondary | ICD-10-CM | POA: Diagnosis not present

## 2016-11-09 DIAGNOSIS — M7711 Lateral epicondylitis, right elbow: Secondary | ICD-10-CM | POA: Diagnosis not present

## 2016-11-19 ENCOUNTER — Encounter: Payer: Self-pay | Admitting: Adult Health

## 2016-11-19 MED ORDER — FLUTICASONE PROPIONATE 50 MCG/ACT NA SUSP: 1.0000 | Freq: Every day | NASAL | 1 refills | Status: DC

## 2016-11-30 DIAGNOSIS — M7711 Lateral epicondylitis, right elbow: Secondary | ICD-10-CM | POA: Diagnosis not present

## 2016-11-30 DIAGNOSIS — M79632 Pain in left forearm: Secondary | ICD-10-CM | POA: Diagnosis not present

## 2016-12-11 ENCOUNTER — Encounter: Payer: Self-pay | Admitting: Adult Health

## 2016-12-16 ENCOUNTER — Ambulatory Visit (INDEPENDENT_AMBULATORY_CARE_PROVIDER_SITE_OTHER): Payer: 59 | Admitting: Licensed Clinical Social Worker

## 2016-12-16 DIAGNOSIS — F329 Major depressive disorder, single episode, unspecified: Secondary | ICD-10-CM | POA: Diagnosis not present

## 2017-01-06 ENCOUNTER — Ambulatory Visit (INDEPENDENT_AMBULATORY_CARE_PROVIDER_SITE_OTHER): Payer: 59 | Admitting: Licensed Clinical Social Worker

## 2017-01-06 DIAGNOSIS — F329 Major depressive disorder, single episode, unspecified: Secondary | ICD-10-CM | POA: Diagnosis not present

## 2017-01-13 ENCOUNTER — Ambulatory Visit: Payer: 59 | Admitting: Licensed Clinical Social Worker

## 2017-01-19 ENCOUNTER — Ambulatory Visit (INDEPENDENT_AMBULATORY_CARE_PROVIDER_SITE_OTHER): Payer: 59 | Admitting: Adult Health

## 2017-01-19 ENCOUNTER — Encounter: Payer: Self-pay | Admitting: Adult Health

## 2017-01-19 VITALS — BP 134/82 | Temp 99.1°F | Wt 191.0 lb

## 2017-01-19 DIAGNOSIS — Z23 Encounter for immunization: Secondary | ICD-10-CM | POA: Diagnosis not present

## 2017-01-19 DIAGNOSIS — M545 Low back pain, unspecified: Secondary | ICD-10-CM

## 2017-01-19 MED ORDER — CYCLOBENZAPRINE HCL 10 MG PO TABS: 10.0000 mg | ORAL_TABLET | Freq: Every day | ORAL | 0 refills | Status: DC

## 2017-01-19 MED ORDER — METHYLPREDNISOLONE 4 MG PO TBPK: ORAL_TABLET | ORAL | 0 refills | Status: DC

## 2017-01-19 NOTE — Progress Notes (Signed)
Subjective:    Patient ID: Raymond Johnson, male    DOB: 08-Jun-1965, 51 y.o.   MRN: [id number]  HPI  51 year old male who  has a past medical history of Anxiety; Chronic headaches; Depression; Diverticulitis; ED (erectile dysfunction); Elevated liver enzymes; Hyperlipidemia; Hypertension; Insomnia; and Seasonal allergies.  He presents to the office today for the acute complaint of low back pain. He reports that his pain started over the weekend after he was dancing at a wedding. He woke up the next morning with "severe, 8/10, left lower back pain." Denies any pain with palpation but has pain with movements and walking. He has been using Aleve which helps with the pain ( 3/10).   Denies any issues with bowel or bladder   Review of Systems See HPI   Past Medical History:  Diagnosis Date  . Anxiety   . Chronic headaches   . Depression   . Diverticulitis   . ED (erectile dysfunction)   . Elevated liver enzymes   . Hyperlipidemia   . Hypertension   . Insomnia   . Seasonal allergies     Social History   Social History  . Marital status: Unknown    Spouse name: N/A  . Number of children: N/A  . Years of education: N/A   Occupational History  . Not on file.   Social History Main Topics  . Smoking status: Former Research scientist (life sciences)  . Smokeless tobacco: Never Used  . Alcohol use Not on file     Comment: "socially"  . Drug use: No  . Sexual activity: Not on file   Other Topics Concern  . 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  Family History  Problem Relation Age of Onset  . Breast cancer Mother   . Lung cancer Father   . Prostate cancer Brother 69  . Cancer Paternal Grandfather     No Known Allergies  Current Outpatient Prescriptions on File Prior to Visit  Medication Sig  Dispense Refill  . amLODipine-benazepril (LOTREL) 5-20 MG capsule Take 1 capsule by mouth daily. 90 capsule 3  . fluticasone (FLONASE) 50 MCG/ACT nasal spray Place 1 spray into both nostrils daily. 48 g 1  . mirtazapine (REMERON) 7.5 MG tablet Take 1 tablet (7.5 mg total) by mouth at bedtime. 90 tablet 1  . omega-3 acid ethyl esters (LOVAZA) 1 g capsule Take 1 capsule (1 g total) by mouth 3 (three) times daily. 270 capsule 3  . tadalafil (CIALIS) 20 MG tablet Take 20 mg by mouth daily as needed.     No current facility-administered medications on file prior to visit.     BP 134/82 (BP Location: Left Arm)   Temp 99.1 F (37.3 C) (Oral)   Wt 191 lb (86.6 kg)       Objective:   Physical Exam  Constitutional: He is oriented to person, place, and time. He appears well-developed and well-nourished. No distress.  Cardiovascular: Normal rate, regular rhythm, normal heart sounds and intact distal pulses.  Exam reveals no friction rub.   No murmur heard. Pulmonary/Chest: Effort normal and breath sounds normal. No respiratory distress. He has no wheezes. He has no rales. He exhibits no  tenderness.  Musculoskeletal: He exhibits tenderness (tenderness with palpation to left lower back. No spinal tenderness). He exhibits no edema or deformity.  Neurological: He is alert and oriented to person, place, and time.  Skin: Skin is warm and dry. No rash noted. He is not diaphoretic. No erythema. No pallor.  Psychiatric: He has a normal mood and affect. His behavior is normal. Judgment and thought content normal.  Nursing note and vitals reviewed.     Assessment & Plan:  1. Acute left-sided low back pain without sciatica - Appears as sprain. Continue with Nsaids, use a heating pad. Will prescribe medrol dose pack. Follow up if no improvement in the next 2-3 days  - methylPREDNISolone (MEDROL DOSEPAK) 4 MG TBPK tablet; Take as directed  Dispense: 21 tablet; Refill: 0  2. Need for influenza  vaccination  - Flu Vaccine QUAD 6+ mos PF IM (Fluarix Quad PF)   [name], NP

## 2017-01-20 ENCOUNTER — Encounter: Payer: Self-pay | Admitting: Adult Health

## 2017-01-21 ENCOUNTER — Encounter: Payer: Self-pay | Admitting: Adult Health

## 2017-01-21 ENCOUNTER — Other Ambulatory Visit: Payer: Self-pay | Admitting: Adult Health

## 2017-01-22 ENCOUNTER — Encounter: Payer: Self-pay | Admitting: Adult Health

## 2017-01-22 ENCOUNTER — Ambulatory Visit (INDEPENDENT_AMBULATORY_CARE_PROVIDER_SITE_OTHER): Payer: 59 | Admitting: Adult Health

## 2017-01-22 ENCOUNTER — Ambulatory Visit (INDEPENDENT_AMBULATORY_CARE_PROVIDER_SITE_OTHER)
Admission: RE | Admit: 2017-01-22 | Discharge: 2017-01-22 | Disposition: A | Discharge status: Home / Self Care | Payer: 59 | Source: Ambulatory Visit | Attending: Adult Health | Admitting: Adult Health

## 2017-01-22 VITALS — BP 150/90 | Temp 98.5°F | Wt 191.0 lb

## 2017-01-22 DIAGNOSIS — M545 Low back pain, unspecified: Secondary | ICD-10-CM

## 2017-01-22 DIAGNOSIS — N200 Calculus of kidney: Secondary | ICD-10-CM

## 2017-01-22 MED ORDER — TIZANIDINE HCL 4 MG PO TABS: 4.0000 mg | ORAL_TABLET | Freq: Every day | ORAL | 0 refills | Status: DC

## 2017-01-22 MED ORDER — TAMSULOSIN HCL 0.4 MG PO CAPS: 0.4000 mg | ORAL_CAPSULE | Freq: Every day | ORAL | 3 refills | Status: DC

## 2017-01-22 NOTE — Telephone Encounter (Signed)
Spoke to patient and advised him of his x ray results. He has a 25mm stone in the left ureter.   It was recommended that he have a non contrast CT abdomen and pelvis.   I will prescribe Flomax and do an urgent referral to urology in case the stone does not pass.   Advised to go to the ER over the weekend if fever develops or he has uncontrolled pain

## 2017-01-22 NOTE — Progress Notes (Signed)
Subjective:    Patient ID: Raymond Johnson, male    DOB: [birth year], 51 y.o.   MRN: [id number]  HPI  51 year old male who  has a past medical history of Anxiety; Chronic headaches; Depression; Diverticulitis; ED (erectile dysfunction); Elevated liver enzymes; Hyperlipidemia; Hypertension; Insomnia; and Seasonal allergies. he presents to the office today for continued left sided low back pain. I saw him earlier this week and prescribed a dose pack and flexeril. He has been taking this but not feeling any improved. Last night I sent in a short course of Tramadol 50 mg BID PRN. He started this last night but did not see any improvement in his symptoms.   Denies any issues with bowel or bladder.   Review of Systems See HPI   Past Medical History:  Diagnosis Date  . Anxiety   . Chronic headaches   . Depression   . Diverticulitis   . ED (erectile dysfunction)   . Elevated liver enzymes   . Hyperlipidemia   . Hypertension   . Insomnia   . Seasonal allergies     Social History   Social History  . Marital status: Unknown    Spouse name: N/A  . Number of children: N/A  . Years of education: N/A   Occupational History  . Not on file.   Social History Main Topics  . Smoking status: Former Research scientist (life sciences)  . Smokeless tobacco: Never Used  . Alcohol use Not on file     Comment: "socially"  . Drug use: No  . Sexual activity: Not on file   Other Topics Concern  . 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  Family History  Problem Relation Age of Onset  . Breast cancer Mother   . Lung cancer Father   . Prostate cancer Brother 74  . Cancer Paternal Grandfather     No Known Allergies  Current Outpatient Prescriptions on File Prior to Visit  Medication Sig Dispense Refill  .  amLODipine-benazepril (LOTREL) 5-20 MG capsule Take 1 capsule by mouth daily. 90 capsule 3  . fluticasone (FLONASE) 50 MCG/ACT nasal spray Place 1 spray into both nostrils daily. 48 g 1  . methylPREDNISolone (MEDROL DOSEPAK) 4 MG TBPK tablet Take as directed 21 tablet 0  . mirtazapine (REMERON) 7.5 MG tablet Take 1 tablet (7.5 mg total) by mouth at bedtime. 90 tablet 1  . omega-3 acid ethyl esters (LOVAZA) 1 g capsule Take 1 capsule (1 g total) by mouth 3 (three) times daily. 270 capsule 3  . tadalafil (CIALIS) 20 MG tablet Take 20 mg by mouth daily as needed.     No current facility-administered medications on file prior to visit.     BP (!) 150/90 (BP Location: Left Arm)   Temp 98.5 F (36.9 C) (Oral)   Wt 191 lb (86.6 kg)       Objective:   Physical Exam  Constitutional: He is oriented to person, place, and time. He appears well-developed and well-nourished. No distress.  Does not appear in any acute distress  Cardiovascular: Normal rate, regular rhythm, normal heart sounds and intact distal pulses.  Exam reveals no gallop and no friction rub.   No  murmur heard. Musculoskeletal: Normal range of motion. He exhibits tenderness (sligth tenderness with palpation to left lower back ).  Neurological: He is alert and oriented to person, place, and time.  Skin: Skin is warm and dry. No rash noted. He is not diaphoretic. No erythema. No pallor.  Psychiatric: He has a normal mood and affect. His behavior is normal. Judgment and thought content normal.  Nursing note and vitals reviewed.      Assessment & Plan:  1. Acute left-sided low back pain without sciatica - He does not appear in any acute distress. Will get x ray and refer to PT. Encouraged massage therapy  - traMADol (ULTRAM) 50 MG tablet;  - Ambulatory referral to Physical Therapy - tiZANidine (ZANAFLEX) 4 MG tablet; Take 1 tablet (4 mg total) by mouth at bedtime.  Dispense: 30 tablet; Refill: 0 - DG Lumbar Spine Complete;  Future - Follow up as needed  [name], NP

## 2017-01-22 NOTE — Addendum Note (Signed)
Addended by: Apolinar Junes on: 01/22/2017 05:24 PM   Modules accepted: Orders

## 2017-01-24 ENCOUNTER — Emergency Department (HOSPITAL_COMMUNITY): Payer: 59

## 2017-01-24 ENCOUNTER — Encounter (HOSPITAL_COMMUNITY): Payer: Self-pay

## 2017-01-24 ENCOUNTER — Emergency Department (HOSPITAL_COMMUNITY)
Admission: EM | Admit: 2017-01-24 | Discharge: 2017-01-24 | Disposition: A | Discharge status: Home / Self Care | Payer: 59 | Attending: Physician Assistant | Admitting: Physician Assistant

## 2017-01-24 DIAGNOSIS — R109 Unspecified abdominal pain: Secondary | ICD-10-CM | POA: Diagnosis not present

## 2017-01-24 DIAGNOSIS — I10 Essential (primary) hypertension: Secondary | ICD-10-CM | POA: Insufficient documentation

## 2017-01-24 DIAGNOSIS — Z79899 Other long term (current) drug therapy: Secondary | ICD-10-CM | POA: Insufficient documentation

## 2017-01-24 DIAGNOSIS — Z7983 Long term (current) use of bisphosphonates: Secondary | ICD-10-CM | POA: Diagnosis not present

## 2017-01-24 DIAGNOSIS — N200 Calculus of kidney: Secondary | ICD-10-CM

## 2017-01-24 DIAGNOSIS — Z87891 Personal history of nicotine dependence: Secondary | ICD-10-CM | POA: Diagnosis not present

## 2017-01-24 DIAGNOSIS — M549 Dorsalgia, unspecified: Secondary | ICD-10-CM | POA: Diagnosis present

## 2017-01-24 LAB — BASIC METABOLIC PANEL
Anion gap: 10 (ref 5–15)
BUN: 18 mg/dL (ref 6–20)
CO2: 20 mmol/L — ABNORMAL LOW (ref 22–32)
Calcium: 8.8 mg/dL — ABNORMAL LOW (ref 8.9–10.3)
Chloride: 101 mmol/L (ref 101–111)
Creatinine, Ser: 1.12 mg/dL (ref 0.61–1.24)
GFR calc Af Amer: >60 mL/min (ref >60)
GFR calc non Af Amer: >60 mL/min (ref >60)
Glucose, Bld: 148 mg/dL — ABNORMAL HIGH (ref 65–99)
Potassium: 3.7 mmol/L (ref 3.5–5.1)
Sodium: 131 mmol/L — ABNORMAL LOW (ref 135–145)

## 2017-01-24 LAB — CBC
HCT: 45 % (ref 39.0–52.0)
Hemoglobin: 16.3 g/dL (ref 13.0–17.0)
MCH: 34.1 pg — ABNORMAL HIGH (ref 26.0–34.0)
MCHC: 36.2 g/dL — ABNORMAL HIGH (ref 30.0–36.0)
MCV: 94.1 fL (ref 78.0–100.0)
Platelets: 139 10*3/uL — ABNORMAL LOW (ref 150–400)
RBC: 4.78 MIL/uL (ref 4.22–5.81)
RDW: 11.9 % (ref 11.5–15.5)
WBC: 7.2 10*3/uL (ref 4.0–10.5)

## 2017-01-24 LAB — URINALYSIS, ROUTINE W REFLEX MICROSCOPIC
Bacteria, UA: NONE SEEN
Bilirubin Urine: NEGATIVE
Glucose, UA: NEGATIVE mg/dL
Ketones, ur: 20 mg/dL — AB
Leukocytes, UA: NEGATIVE
Nitrite: NEGATIVE
Protein, ur: NEGATIVE mg/dL
Specific Gravity, Urine: 1.02 (ref 1.005–1.030)
pH: 5 (ref 5.0–8.0)

## 2017-01-24 MED ORDER — FENTANYL CITRATE (PF) 100 MCG/2ML IJ SOLN
50.0000 ug | Freq: Once | INTRAMUSCULAR | Status: AC
  Administered 2017-01-24: 50 ug via INTRAVENOUS
  Filled 2017-01-24: qty 2

## 2017-01-24 MED ORDER — KETOROLAC TROMETHAMINE 30 MG/ML IJ SOLN
30.0000 mg | Freq: Once | INTRAMUSCULAR | Status: AC
  Administered 2017-01-24: 30 mg via INTRAVENOUS
  Filled 2017-01-24: qty 1

## 2017-01-24 MED ORDER — OXYCODONE-ACETAMINOPHEN 5-325 MG PO TABS: 1.0000 | ORAL_TABLET | Freq: Four times a day (QID) | ORAL | 0 refills | Status: DC | PRN

## 2017-01-24 MED ORDER — SODIUM CHLORIDE 0.9 % IV BOLUS (SEPSIS)
1000.0000 mL | Freq: Once | INTRAVENOUS | Status: AC
  Administered 2017-01-24: 1000 mL via INTRAVENOUS

## 2017-01-24 MED ORDER — TAMSULOSIN HCL 0.4 MG PO CAPS: 0.4000 mg | ORAL_CAPSULE | Freq: Every day | ORAL | 0 refills | Status: DC

## 2017-01-24 NOTE — ED Triage Notes (Signed)
Patient complains of ongoing left flank pain since Monday, states that he has had no CT but diagnosed with stone. States pain worse and here for CT

## 2017-01-24 NOTE — ED Notes (Signed)
Patient ambulated to the restroom

## 2017-01-24 NOTE — Discharge Instructions (Signed)
You have a 7 mm stone that is large enough that it may require help from urology to pass it.  Please call urology in the morning.  Please return with any fevers, increased pain, or other concerns.

## 2017-01-24 NOTE — ED Notes (Signed)
Patient refuses to place bp cuff on

## 2017-01-24 NOTE — ED Provider Notes (Signed)
[facility] DEPART[facility] Provider Note   CSN: [id number] Arrival date & time: 01/24/17  1517     History   Chief Complaint No chief complaint on file.   HPI Raymond Johnson is a 51 y.o. male.  HPI   Patient is a 51 year old male presenting with back pain.  Patient has been seen for the last week in urgent care x2.  Patient was initially diagnosed with lumbar strain.  Given muscle relaxants ibuprofen.  He found that his pain was intermittent, not associated with movement.  He  went back to urgent care and was then treated for a kidney stone after plain x-ray showed a questionable stones.  Patient had continued to have pain, no issues urinating, no burning with urination, no fevers.  He does have intermittent back pain.  Does not radiate anywhere. in.  Patient taking ibuprofen Tylenol and oxycodone around-the-clock.  No history of stones.  Past Medical History:  Diagnosis Date  . Anxiety   . Chronic headaches   . Depression   . Diverticulitis   . ED (erectile dysfunction)   . Elevated liver enzymes   . Hyperlipidemia   . Hypertension   . Insomnia   . Seasonal allergies     Patient Active Problem List   Diagnosis Date Noted  . Adjustment disorder with anxious mood 06/01/2013  . ED (erectile dysfunction) 06/01/2013  . Essential hypertension 06/01/2013  . Mixed hyperlipidemia 06/01/2013    History reviewed. No pertinent surgical history.     Home Medications    Prior to Admission medications   Medication Sig Start Date End Date Taking? Authorizing Provider  amLODipine-benazepril (LOTREL) 5-20 MG capsule Take 1 capsule by mouth daily. 05/26/16   Nafziger, Tommi Rumps, NP  fluticasone (FLONASE) 50 MCG/ACT nasal spray Place 1 spray into both nostrils daily. 11/19/16   Nafziger, Tommi Rumps, NP  methylPREDNISolone (MEDROL DOSEPAK) 4 MG TBPK tablet Take as directed 01/19/17   Nafziger, Tommi Rumps, NP  mirtazapine (REMERON) 7.5 MG tablet Take 1 tablet (7.5 mg total)  by mouth at bedtime. 05/26/16   Nafziger, Tommi Rumps, NP  omega-3 acid ethyl esters (LOVAZA) 1 g capsule Take 1 capsule (1 g total) by mouth 3 (three) times daily. 05/21/16 05/16/17  Nafziger, Tommi Rumps, NP  tadalafil (CIALIS) 20 MG tablet Take 20 mg by mouth daily as needed. 06/17/15   [provider]  tamsulosin (FLOMAX) 0.4 MG CAPS capsule Take 1 capsule (0.4 mg total) by mouth daily. 01/22/17   Nafziger, Tommi Rumps, NP  tiZANidine (ZANAFLEX) 4 MG tablet Take 1 tablet (4 mg total) by mouth at bedtime. 01/22/17   Nafziger, Tommi Rumps, NP  traMADol Veatrice Bourbon) 50 MG tablet  01/21/17   [provider]    Family History Family History  Problem Relation Age of Onset  . Breast cancer Mother   . Lung cancer Father   . Prostate cancer Brother 82  . Cancer Paternal Grandfather     Social History Social History   Tobacco Use  . Smoking status: Former Research scientist (life sciences)  . Smokeless tobacco: Never Used  Substance Use Topics  . Alcohol use: Not on file    Comment: "socially"  . Drug use: No     Allergies   Patient has no known allergies.   Review of Systems Review of Systems  Constitutional: Negative for activity change, fatigue and fever.  Respiratory: Negative for shortness of breath.   Cardiovascular: Negative for chest pain.  Gastrointestinal: Negative for abdominal pain, diarrhea, nausea and vomiting.  Genitourinary:  Positive for flank pain. Negative for difficulty urinating and dysuria.  All other systems reviewed and are negative.    Physical Exam Updated Vital Signs BP (!) 166/100   Pulse (!) 107   Temp 98.9 F (37.2 C) (Oral)   Resp 16   SpO2 97%   Physical Exam  Constitutional: He is oriented to person, place, and time. He appears well-nourished.  HENT:  Head: Normocephalic.  Mouth/Throat: Oropharynx is clear and moist.  Eyes: Conjunctivae are normal.  Neck: No tracheal deviation present.  Cardiovascular: Normal rate.  Pulmonary/Chest: Effort normal. No stridor. No respiratory  distress.  Abdominal: Soft. There is tenderness. There is no guarding.  Mildist of tenderness in the mid abdomen left periumbilical.  Mild tenderness in the left back.  Musculoskeletal: Normal range of motion. He exhibits no edema.  Neurological: He is oriented to person, place, and time. No cranial nerve deficit.  Skin: Skin is warm and dry. No rash noted. He is not diaphoretic.  Psychiatric: He has a normal mood and affect. His behavior is normal.  Nursing note and vitals reviewed.    ED Treatments / Results  Labs (all labs ordered are listed, but only abnormal results are displayed) Labs Reviewed  URINALYSIS, ROUTINE W REFLEX MICROSCOPIC - Abnormal; Notable for the following components:      Result Value   Hgb urine dipstick MODERATE (*)    Ketones, ur 20 (*)    Squamous Epithelial / LPF 0-5 (*)    All other components within normal limits  CBC - Abnormal; Notable for the following components:   MCH 34.1 (*)    MCHC 36.2 (*)    Platelets 139 (*)    All other components within normal limits  BASIC METABOLIC PANEL - Abnormal; Notable for the following components:   Sodium 131 (*)    CO2 20 (*)    Glucose, Bld 148 (*)    Calcium 8.8 (*)    All other components within normal limits    EKG  EKG Interpretation None       Radiology Dg Lumbar Spine Complete  Result Date: 01/22/2017 CLINICAL DATA:  51 year old male with left side lumbar back pain and stiffness for 4 days with no known injury. EXAM: LUMBAR SPINE - COMPLETE 4+ VIEW COMPARISON:  None. FINDINGS: Normal lumbar segmentation. Bone mineralization is within normal limits. Mild straightening of lumbar lordosis. Relatively preserved disc spaces. Mild endplate spurring. No pars fracture. Sacral ala and SI joints appear normal. Visible lower thoracic levels appear intact. Non obstructed bowel gas pattern. There is an oval calculus along the expected course of the left ureter measuring up to 9 mm diameter (images one, 3, 4).  There is a separate 2.5 cm rim calcified lesion in the left upper quadrant which might be related to the splenic artery. IMPRESSION: 1. Evidence of a 9 mm left ureteral calculus. Query hematuria and recommend follow-up noncontrast CT Abdomen and Pelvis. 2. 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 Recommend attention on the follow-up CT. 3.  No acute osseous abnormality in the lumbar spine. These results will be called to the ordering clinician or representative by the Radiologist Assistant, and communication documented in the PACS or zVision Dashboard. Electronically Signed   By: Genevie Ann M.D.   On: 01/22/2017 15:54    Procedures Procedures (including critical care time)  Medications Ordered in ED Medications - No data to display   Initial Impression / Assessment and Plan / ED Course  I have reviewed  the triage vital signs and the nursing notes.  Pertinent labs & imaging results that were available during my care of the patient were reviewed by me and considered in my medical decision making (see chart for details).     Patient is a 51 year old male presenting with back pain.  Patient has been seen for the last week in urgent care x2.  Patient was initially diagnosed with lumbar strain.  Given muscle relaxants ibuprofen.  He found that his pain was intermittent, not associated with movement.  He  went back to urgent care and was then treated for a kidney stone after plain x-ray showed a questionable stones.  Patient had continued to have pain, no issues urinating, no burning with urination, no fevers.  He does have intermittent back pain.  Does not radiate anywhere. in.  Patient taking ibuprofen Tylenol and oxycodone around-the-clock.  No history of stones.  11:28 AM Mildly atypical presentation of kidney stones.  Will do CT stone.  Urine does show blood.  Patient has become very inpatient.  Asking nursing to get me so that he can leave before workup completed.  I wanted to wait for CT  because of atypical presentiation,.  CT does show a 7 mm stone.  I told patient he was unlikely to pass this and that he will have to contact urology.  Patient understands the plan and expresses understanding. Patient comfotrable on discharge, dressed and waiting for results.   Will return with fever, increasing pain, or other concerns.  Final Clinical Impressions(s) / ED Diagnoses   Final diagnoses:  None    New Prescriptions This SmartLink is deprecated. Use AVSMEDLIST instead to display the medication list for a patient.   Macarthur Critchley, MD 01/26/17 2142

## 2017-01-24 NOTE — ED Notes (Signed)
Pt. Raymond Johnson he is ready to go, he wants to "reschedule" his CT, nurse informed

## 2017-01-24 NOTE — ED Notes (Signed)
Patient refused to allow discharge vitals.  

## 2017-01-25 ENCOUNTER — Encounter: Payer: Self-pay | Admitting: Adult Health

## 2017-01-25 ENCOUNTER — Other Ambulatory Visit: Payer: Self-pay | Admitting: Urology

## 2017-01-25 ENCOUNTER — Inpatient Hospital Stay: Admission: RE | Admit: 2017-01-25 | Payer: Self-pay | Source: Ambulatory Visit

## 2017-01-25 DIAGNOSIS — N201 Calculus of ureter: Secondary | ICD-10-CM | POA: Diagnosis not present

## 2017-01-26 ENCOUNTER — Encounter (HOSPITAL_COMMUNITY): Payer: Self-pay | Admitting: General Practice

## 2017-01-26 ENCOUNTER — Encounter: Payer: Self-pay | Admitting: Adult Health

## 2017-01-26 NOTE — H&P (Signed)
CC: I have kidney stones.  HPI: Raymond Johnson is a 51 year-old male patient who is here for renal calculi.  The problem is on the left side. He first stated noticing pain on approximately 01/18/2017. This is his first kidney stone. He is currently having flank pain, back pain, and nausea. He denies having groin pain, vomiting, fever, and chills. He has not caught a stone in his urine strainer since his symptoms began.   He has never had surgical treatment for calculi in the past.   CT abd/pel (01/24/2017)- 7 mm left UPJ stone with mild hydronephrosis.   Today, the patient reports intermittent, sharp left-sided flank pain requiring Percocet every 4-6 hours. He denies fevers but reports intermittent chills as well as nausea, but no vomiting. He denies dysuria or hematuria and is voiding without difficulty. No prior history of nephrolithiasis.     ALLERGIES: No Allergies    MEDICATIONS: None   GU PSH: None   NON-GU PSH: None   GU PMH: None   NON-GU PMH: None   FAMILY HISTORY: None   SOCIAL HISTORY: None   REVIEW OF SYSTEMS:    GU Review Male:   Patient denies frequent urination, hard to postpone urination, burning/ pain with urination, get up at night to urinate, leakage of urine, stream starts and stops, trouble starting your stream, have to strain to urinate , erection problems, and penile pain.  Gastrointestinal (Upper):   Patient denies nausea, vomiting, and indigestion/ heartburn.  Gastrointestinal (Lower):   Patient denies diarrhea and constipation.  Constitutional:   Patient denies fatigue, night sweats, weight loss, and fever.  Skin:   Patient denies skin rash/ lesion and itching.  Eyes:   Patient denies blurred vision and double vision.  Ears/ Nose/ Throat:   Patient denies sore throat and sinus problems.  Hematologic/Lymphatic:   Patient denies swollen glands and easy bruising.  Cardiovascular:   Patient denies leg swelling and chest pains.  Respiratory:   Patient  denies cough and shortness of breath.  Endocrine:   Patient denies excessive thirst.  Musculoskeletal:   Patient denies back pain and joint pain.  Neurological:   Patient denies headaches and dizziness.  Psychologic:   Patient denies depression and anxiety.   VITAL SIGNS:      01/25/2017 11:39 AM  Weight 193 lb / 87.54 kg   MULTI-SYSTEM PHYSICAL EXAMINATION:    Constitutional: Well-nourished. No physical deformities. Normally developed. Good grooming.  Neck: Neck symmetrical, not swollen. Normal tracheal position.  Respiratory: No labored breathing, no use of accessory muscles.   Cardiovascular: Normal temperature, normal extremity pulses, no swelling, no varicosities.  Lymphatic: No enlargement of neck, axillae, groin.  Skin: No paleness, no jaundice, no cyanosis. No lesion, no ulcer, no rash.  Neurologic / Psychiatric: Oriented to time, oriented to place, oriented to person. No depression, no anxiety, no agitation.  Gastrointestinal: No mass, no tenderness, no rigidity, non obese abdomen.  Eyes: Normal conjunctivae. Normal eyelids.  Ears, Nose, Mouth, and Throat: Left ear no scars, no lesions, no masses. Right ear no scars, no lesions, no masses. Nose no scars, no lesions, no masses. Normal hearing. Normal lips.  Musculoskeletal: Normal gait and station of head and neck.     PAST DATA REVIEWED:  Source Of History:  Patient   PROCEDURES:         KUB - [id number]  A single view of the abdomen is obtained.      7 mm left UPJ stone seen along the  expected course of the left ureter (consistent with CT from 01/24/2017). No other bony abnormalities noted. No abnormal bowel gas patterns or signs of free air.         Urinalysis w/Scope Dipstick Dipstick Cont'd Micro  Color: Yellow Bilirubin: Neg WBC/hpf: 0 - 5/hpf  Appearance: Clear Ketones: Neg RBC/hpf: 0 - 2/hpf  Specific Gravity: 1.010 Blood: Trace Bacteria: NS (Not Seen)  pH: 5.5 Protein: Neg Cystals: NS (Not Seen)  Glucose: Neg  Urobilinogen: 0.2 Casts: NS (Not Seen)    Nitrites: Neg Trichomonas: Not Present    Leukocyte Esterase: Neg Mucous: Not Present      Epithelial Cells: 0 - 5/hpf      Yeast: NS (Not Seen)      Sperm: Not Present    ASSESSMENT:      ICD-10 Details  1 GU:   Ureteral calculus - N20.1    PLAN:            Medications New Meds: Percocet 7.5 mg-325 mg tablet 1 tablet PO Q 4 H PRN   #12  0 Refill(s)            Orders X-Rays: KUB          Schedule Return Visit/Planned Activity: ASAP - Schedule Surgery          Document Letter(s):  Created for Patient: Clinical Summary         Notes:   We discussed the management of kidney stones. These options include observation, ureteroscopy, shockwave lithotripsy, and PCNL. We discussed which options are relevant to the patient's stone(s). We discussed the natural history of stones as well as the complications of untreated stones and the impact on quality of life without treatment as well as with each of the above listed treatments. We also discussed the efficacy of each treatment in its ability to clear the stone burden. With any of these management options I discussed the signs and symptoms of infection and the need for emergent treatment should these be experienced. For each option we discussed the ability of each procedure to clear the patient of their stone burden.  For observation I described the risks which include but are not limited to silent renal damage, life-threatening infection, need for emergent surgery, failure to pass stone, and pain.  For ureteroscopy I described the risks which include heart attack, stroke, pulmonary embolus, death, bleeding, infection, damage to contiguous structures, positioning injury, ureteral stricture, ureteral avulsion, ureteral injury, need for ureteral stent, inability to perform ureteroscopy, need for an interval procedure, inability to clear stone burden, stent discomfort and pain.  For shockwave lithotripsy  I described the risks which include arrhythmia, kidney contusion, kidney hemorrhage, need for transfusion, pain, inability to break up stone, inability to pass stone fragments, Steinstrasse, infection associated with obstructing stones, need for different surgical procedure, need for repeat shockwave lithotripsy, and death.  For PCNL I described the risks including positioning injury, pneumothorax, hydrothorax, need for chest tube, inability to clear stone burden, renal laceration, arterial venous fistula or malformation, need for embolization of kidney, loss of kidney or renal function, need for repeat procedure, need for prolonged nephrostomy tube, ureteral avulsion and inherent risks of general anesthesia.   -KUB today. Rx for percocet provided. Instructed the patient to hold Ibuprofen prior to surgery.  -Will proceed with left ESWL

## 2017-01-27 ENCOUNTER — Encounter: Payer: Self-pay | Admitting: Adult Health

## 2017-01-28 ENCOUNTER — Ambulatory Visit (HOSPITAL_COMMUNITY): Payer: 59

## 2017-01-28 ENCOUNTER — Ambulatory Visit (HOSPITAL_COMMUNITY)
Admission: RE | Admit: 2017-01-28 | Discharge: 2017-01-28 | Disposition: A | Discharge status: Home / Self Care | Payer: 59 | Source: Ambulatory Visit | Attending: Urology | Admitting: Urology

## 2017-01-28 ENCOUNTER — Encounter (HOSPITAL_COMMUNITY): Payer: Self-pay | Admitting: *Deleted

## 2017-01-28 ENCOUNTER — Encounter (HOSPITAL_COMMUNITY)
Admission: RE | Disposition: A | Discharge status: Home / Self Care | Payer: Self-pay | Source: Ambulatory Visit | Attending: Urology

## 2017-01-28 DIAGNOSIS — N132 Hydronephrosis with renal and ureteral calculous obstruction: Secondary | ICD-10-CM | POA: Diagnosis not present

## 2017-01-28 DIAGNOSIS — N201 Calculus of ureter: Secondary | ICD-10-CM | POA: Diagnosis present

## 2017-01-28 HISTORY — DX: Personal history of urinary calculi: Z87.442

## 2017-01-28 HISTORY — PX: EXTRACORPOREAL SHOCK WAVE LITHOTRIPSY: SHX1557

## 2017-01-28 SURGERY — LITHOTRIPSY, ESWL
Anesthesia: LOCAL | Laterality: Left

## 2017-01-28 MED ORDER — CIPROFLOXACIN HCL 500 MG PO TABS
500.0000 mg | ORAL_TABLET | ORAL | Status: AC
Start: 2017-01-28 — End: 2017-01-28
  Administered 2017-01-28: 500 mg via ORAL
  Filled 2017-01-28: qty 1

## 2017-01-28 MED ORDER — OXYCODONE HCL 5 MG PO TABS: 5.0000 mg | ORAL_TABLET | ORAL | Status: DC | PRN

## 2017-01-28 MED ORDER — SODIUM CHLORIDE 0.9 % IV SOLN
INTRAVENOUS | Status: DC
  Administered 2017-01-28: 09:00:00 via INTRAVENOUS

## 2017-01-28 MED ORDER — SODIUM CHLORIDE 0.9% FLUSH: 3.0000 mL | Freq: Two times a day (BID) | INTRAVENOUS | Status: DC

## 2017-01-28 MED ORDER — MORPHINE SULFATE (PF) 4 MG/ML IV SOLN: 2.0000 mg | INTRAVENOUS | Status: DC | PRN

## 2017-01-28 MED ORDER — OXYCODONE-ACETAMINOPHEN 5-325 MG PO TABS: 1.0000 | ORAL_TABLET | ORAL | 0 refills | Status: DC | PRN

## 2017-01-28 MED ORDER — ACETAMINOPHEN 650 MG RE SUPP
650.0000 mg | RECTAL | Status: DC | PRN
  Filled 2017-01-28: qty 1

## 2017-01-28 MED ORDER — ONDANSETRON HCL 4 MG PO TABS: 4.0000 mg | ORAL_TABLET | Freq: Three times a day (TID) | ORAL | 0 refills | Status: DC | PRN

## 2017-01-28 MED ORDER — TAMSULOSIN HCL 0.4 MG PO CAPS: 0.4000 mg | ORAL_CAPSULE | Freq: Every day | ORAL | 3 refills | Status: DC

## 2017-01-28 MED ORDER — SODIUM CHLORIDE 0.9 % IV SOLN: 250.0000 mL | INTRAVENOUS | Status: DC | PRN

## 2017-01-28 MED ORDER — ACETAMINOPHEN 325 MG PO TABS: 650.0000 mg | ORAL_TABLET | ORAL | Status: DC | PRN

## 2017-01-28 MED ORDER — DIAZEPAM 5 MG PO TABS
10.0000 mg | ORAL_TABLET | ORAL | Status: AC
  Administered 2017-01-28: 10 mg via ORAL
  Filled 2017-01-28: qty 2

## 2017-01-28 MED ORDER — SODIUM CHLORIDE 0.9% FLUSH: 3.0000 mL | INTRAVENOUS | Status: DC | PRN

## 2017-01-28 MED ORDER — DIPHENHYDRAMINE HCL 25 MG PO CAPS
25.0000 mg | ORAL_CAPSULE | ORAL | Status: AC
  Administered 2017-01-28: 25 mg via ORAL
  Filled 2017-01-28: qty 1

## 2017-01-28 NOTE — Discharge Instructions (Signed)
Moderate Conscious Sedation, Adult, Care After °These instructions provide you with information about caring for yourself after your procedure. Your health care provider may also give you more specific instructions. Your treatment has been planned according to current medical practices, but problems sometimes occur. Call your health care provider if you have any problems or questions after your procedure. °What can I expect after the procedure? °After your procedure, it is common: °· To feel sleepy for several hours. °· To feel clumsy and have poor balance for several hours. °· To have poor judgment for several hours. °· To vomit if you eat too soon. ° °Follow these instructions at home: °For at least 24 hours after the procedure: ° °· Do not: °? Participate in activities where you could fall or become injured. °? Drive. °? Use heavy machinery. °? Drink alcohol. °? Take sleeping pills or medicines that cause drowsiness. °? Make important decisions or sign legal documents. °? Take care of children on your own. °· Rest. °Eating and drinking °· Follow the diet recommended by your health care provider. °· If you vomit: °? Drink water, juice, or soup when you can drink without vomiting. °? Make sure you have little or no nausea before eating solid foods. °General instructions °· Have a responsible adult stay with you until you are awake and alert. °· Take over-the-counter and prescription medicines only as told by your health care provider. °· If you smoke, do not smoke without supervision. °· Keep all follow-up visits as told by your health care provider. This is important. °Contact a health care provider if: °· You keep feeling nauseous or you keep vomiting. °· You feel light-headed. °· You develop a rash. °· You have a fever. °Get help right away if: °· You have trouble breathing. °This information is not intended to replace advice given to you by your health care provider. Make sure you discuss any questions you have  with your health care provider. °Document Released: 12/28/2012 Document Revised: 08/12/2015 Document Reviewed: 06/29/2015 °Elsevier Interactive Patient Education © 2018 [employer] °Lithotripsy, Care After °This sheet gives you information about how to care for yourself after your procedure. Your health care provider may also give you more specific instructions. If you have problems or questions, contact your health care provider. °What can I expect after the procedure? °After the procedure, it is common to have: °· Some blood in your urine. This should only last for a few days. °· Soreness in your back, sides, or upper abdomen for a few days. °· Blotches or bruises on your back where the pressure wave entered the skin. °· Pain, discomfort, or nausea when pieces (fragments) of the kidney stone move through the tube that carries urine from the kidney to the bladder (ureter). Stone fragments may pass soon after the procedure, but they may continue to pass for up to 4-8 weeks. °? If you have severe pain or nausea, contact your health care provider. This may be caused by a large stone that was not broken up, and this may mean that you need more treatment. °· Some pain or discomfort during urination. °· Some pain or discomfort in the lower abdomen or (in men) at the base of the penis. ° °Follow these instructions at home: °Medicines °· Take over-the-counter and prescription medicines only as told by your health care provider. °· If you were prescribed an antibiotic medicine, take it as told by your health care provider. Do not stop taking the antibiotic even if you   start to feel better.  Do not drive for 24 hours if you were given a medicine to help you relax (sedative).  Do not drive or use heavy machinery while taking prescription pain medicine. Eating and drinking  Drink enough water and fluids to keep your urine clear or pale yellow. This helps any remaining pieces of the stone to pass. It can also help  prevent new stones from forming.  Eat plenty of fresh fruits and vegetables.  Follow instructions from your health care provider about eating and drinking restrictions. You may be instructed: ? To reduce how much salt (sodium) you eat or drink. Check ingredients and nutrition facts on packaged foods and beverages. ? To reduce how much meat you eat.  Eat the recommended amount of calcium for your age and gender. Ask your health care provider how much calcium you should have. General instructions  Get plenty of rest.  Most people can resume normal activities 1-2 days after the procedure. Ask your health care provider what activities are safe for you.  If directed, strain all urine through the strainer that was provided by your health care provider. ? Keep all fragments for your health care provider to see. Any stones that are found may be sent to a medical lab for examination. The stone may be as small as a grain of salt.  Keep all follow-up visits as told by your health care provider. This is important. Contact a health care provider if:  You have pain that is severe or does not get better with medicine.  You have nausea that is severe or does not go away.  You have blood in your urine longer than your health care provider told you to expect.  You have more blood in your urine.  You have pain during urination that does not go away.  You urinate more frequently than usual and this does not go away.  You develop a rash or any other possible signs of an allergic reaction. Get help right away if:  You have severe pain in your back, sides, or upper abdomen.  You have severe pain while urinating.  Your urine is very dark red.  You have blood in your stool (feces).  You cannot pass any urine at all.  You feel a strong urge to urinate after emptying your bladder.  You have a fever or chills.  You develop shortness of breath, difficulty breathing, or chest pain.  You have  severe nausea that leads to persistent vomiting.  You faint. Summary  After this procedure, it is common to have some pain, discomfort, or nausea when pieces (fragments) of the kidney stone move through the tube that carries urine from the kidney to the bladder (ureter). If this pain or nausea is severe, however, you should contact your health care provider.  Most people can resume normal activities 1-2 days after the procedure. Ask your health care provider what activities are safe for you.  Drink enough water and fluids to keep your urine clear or pale yellow. This helps any remaining pieces of the stone to pass, and it can help prevent new stones from forming.  If directed, strain your urine and keep all fragments for your health care provider to see. Fragments or stones may be as small as a grain of salt.  Get help right away if you have severe pain in your back, sides, or upper abdomen or have severe pain while urinating. This information is not intended to replace advice  given to you by your health care provider. Make sure you discuss any questions you have with your health care provider. Document Released: 03/29/2007 Document Revised: 01/29/2016 Document Reviewed: 01/29/2016 Elsevier Interactive Patient Education  2017 [employer].

## 2017-01-28 NOTE — Interval H&P Note (Signed)
History and Physical Interval Note:  No change in stone  01/28/2017 10:11 AM  Raymond Johnson  has presented today for surgery, with the diagnosis of LEFT URETEROPELVIC JUNCTION STONE  The various methods of treatment have been discussed with the patient and family. After consideration of risks, benefits and other options for treatment, the patient has consented to  Procedure(s): LEFT EXTRACORPOREAL SHOCK WAVE LITHOTRIPSY (ESWL) (Left) as a surgical intervention .  The patient's history has been reviewed, patient examined, no change in status, stable for surgery.  I have reviewed the patient's chart and labs.  Questions were answered to the patient's satisfaction.     Valmai Vandenberghe J

## 2017-02-02 ENCOUNTER — Ambulatory Visit: Payer: 59 | Admitting: Licensed Clinical Social Worker

## 2017-02-15 DIAGNOSIS — N201 Calculus of ureter: Secondary | ICD-10-CM | POA: Diagnosis not present

## 2017-02-20 ENCOUNTER — Other Ambulatory Visit: Payer: Self-pay | Admitting: Adult Health

## 2017-02-20 DIAGNOSIS — M545 Low back pain, unspecified: Secondary | ICD-10-CM

## 2017-02-23 NOTE — Telephone Encounter (Signed)
Denied.  Not sure if pt wants refills or request is being sent by pharmacy due to date.  Message sent to the pharmacy to have pt contact the office for further refills.

## 2017-03-29 ENCOUNTER — Encounter: Payer: Self-pay | Admitting: Adult Health

## 2017-03-30 MED ORDER — MIRTAZAPINE 7.5 MG PO TABS: 7.5000 mg | ORAL_TABLET | Freq: Every day | ORAL | 0 refills | Status: DC

## 2017-04-20 ENCOUNTER — Ambulatory Visit: Payer: 59 | Admitting: Licensed Clinical Social Worker

## 2017-05-18 ENCOUNTER — Other Ambulatory Visit: Payer: Self-pay | Admitting: Adult Health

## 2017-05-20 MED ORDER — OMEGA-3-ACID ETHYL ESTERS 1 G PO CAPS: 1.0000 g | ORAL_CAPSULE | Freq: Three times a day (TID) | ORAL | 0 refills | Status: DC

## 2017-05-20 NOTE — Telephone Encounter (Signed)
Sent to the pharmacy by e-scribe.  Pt has cpx scheduled for 06/15/17.

## 2017-05-24 ENCOUNTER — Ambulatory Visit (INDEPENDENT_AMBULATORY_CARE_PROVIDER_SITE_OTHER): Payer: 59 | Admitting: Licensed Clinical Social Worker

## 2017-05-24 DIAGNOSIS — F329 Major depressive disorder, single episode, unspecified: Secondary | ICD-10-CM

## 2017-06-15 ENCOUNTER — Encounter: Payer: Self-pay | Admitting: Adult Health

## 2017-06-15 ENCOUNTER — Ambulatory Visit (INDEPENDENT_AMBULATORY_CARE_PROVIDER_SITE_OTHER): Payer: 59 | Admitting: Adult Health

## 2017-06-15 VITALS — BP 142/82 | Temp 98.7°F | Ht 68.75 in | Wt 189.0 lb

## 2017-06-15 DIAGNOSIS — E782 Mixed hyperlipidemia: Secondary | ICD-10-CM

## 2017-06-15 DIAGNOSIS — Z0000 Encounter for general adult medical examination without abnormal findings: Secondary | ICD-10-CM | POA: Diagnosis not present

## 2017-06-15 DIAGNOSIS — I10 Essential (primary) hypertension: Secondary | ICD-10-CM

## 2017-06-15 DIAGNOSIS — F4322 Adjustment disorder with anxiety: Secondary | ICD-10-CM

## 2017-06-15 DIAGNOSIS — Z125 Encounter for screening for malignant neoplasm of prostate: Secondary | ICD-10-CM | POA: Diagnosis not present

## 2017-06-15 LAB — CBC WITH DIFFERENTIAL/PLATELET
Basophils Absolute: 0 10*3/uL (ref 0.0–0.1)
Basophils Relative: 0.5 % (ref 0.0–3.0)
Eosinophils Absolute: 0 10*3/uL (ref 0.0–0.7)
Eosinophils Relative: 1.1 % (ref 0.0–5.0)
HCT: 47.2 % (ref 39.0–52.0)
Hemoglobin: 16.5 g/dL (ref 13.0–17.0)
Lymphocytes Relative: 21 % (ref 12.0–46.0)
Lymphs Abs: 0.8 10*3/uL (ref 0.7–4.0)
MCHC: 35 g/dL (ref 30.0–36.0)
MCV: 98.6 fl (ref 78.0–100.0)
Monocytes Absolute: 0.5 10*3/uL (ref 0.1–1.0)
Monocytes Relative: 11.2 % (ref 3.0–12.0)
Neutro Abs: 2.6 10*3/uL (ref 1.4–7.7)
Neutrophils Relative %: 66.2 % (ref 43.0–77.0)
Platelets: 137 10*3/uL — ABNORMAL LOW (ref 150.0–400.0)
RBC: 4.79 Mil/uL (ref 4.22–5.81)
RDW: 12.6 % (ref 11.5–15.5)
WBC: 4 10*3/uL (ref 4.0–10.5)

## 2017-06-15 LAB — TSH: TSH: 1.91 u[IU]/mL (ref 0.35–4.50)

## 2017-06-15 LAB — LIPID PANEL
Cholesterol: 226 mg/dL — ABNORMAL HIGH (ref 0–200)
HDL: 73.6 mg/dL (ref >39.00)
LDL Cholesterol: 126 mg/dL — ABNORMAL HIGH (ref 0–99)
NonHDL: 151.95
Total CHOL/HDL Ratio: 3
Triglycerides: 130 mg/dL (ref 0.0–149.0)
VLDL: 26 mg/dL (ref 0.0–40.0)

## 2017-06-15 LAB — COMPREHENSIVE METABOLIC PANEL
ALT: 48 U/L (ref 0–53)
AST: 44 U/L — ABNORMAL HIGH (ref 0–37)
Albumin: 4.6 g/dL (ref 3.5–5.2)
Alkaline Phosphatase: 55 U/L (ref 39–117)
BUN: 14 mg/dL (ref 6–23)
CO2: 25 mEq/L (ref 19–32)
Calcium: 9.7 mg/dL (ref 8.4–10.5)
Chloride: 103 mEq/L (ref 96–112)
Creatinine, Ser: 0.97 mg/dL (ref 0.40–1.50)
GFR: 85.8 mL/min (ref >60.00)
Glucose, Bld: 114 mg/dL — ABNORMAL HIGH (ref 70–99)
Potassium: 4.1 mEq/L (ref 3.5–5.1)
Sodium: 138 mEq/L (ref 135–145)
Total Bilirubin: 0.9 mg/dL (ref 0.2–1.2)
Total Protein: 6.9 g/dL (ref 6.0–8.3)

## 2017-06-15 LAB — PSA: PSA: 0.71 ng/mL (ref 0.10–4.00)

## 2017-06-15 MED ORDER — SERTRALINE HCL 50 MG PO TABS: 50.0000 mg | ORAL_TABLET | Freq: Every day | ORAL | 0 refills | Status: DC

## 2017-06-15 MED ORDER — AMLODIPINE BESY-BENAZEPRIL HCL 5-20 MG PO CAPS: 1.0000 | ORAL_CAPSULE | Freq: Every day | ORAL | 3 refills | Status: DC

## 2017-06-15 MED ORDER — OMEGA-3-ACID ETHYL ESTERS 1 G PO CAPS: 1.0000 g | ORAL_CAPSULE | Freq: Three times a day (TID) | ORAL | 3 refills | Status: DC

## 2017-06-15 MED ORDER — MIRTAZAPINE 7.5 MG PO TABS: 7.5000 mg | ORAL_TABLET | Freq: Every day | ORAL | 1 refills | Status: DC

## 2017-06-15 NOTE — Progress Notes (Signed)
Subjective:    Patient ID: Raymond Johnson, male    DOB: 05/15/1965, 52 y.o.   MRN: [id number]  HPI  Patient presents for yearly preventative medicine examination. He is a pleasant 52 year old male who  has a past medical history of Anxiety, Chronic headaches, Diverticulitis, ED (erectile dysfunction), Elevated liver enzymes, History of kidney stones, Hypertension, and Seasonal allergies.  He takes Lotrel 5-20 mg for essential hypertension. His BP is well controlled at home.  BP Readings from Last 3 Encounters:  06/15/17 (!) 142/82  01/28/17 (!) 157/93  01/24/17 (!) 160/95   Hyperlipidemia - takes Lovaza TID  Lab Results  Component Value Date   CHOL 227 (H) 05/21/2016   HDL 69.70 05/21/2016   LDLCALC 134 (H) 05/21/2016   TRIG 113.0 05/21/2016   CHOLHDL 3 05/21/2016   He has a history of Anxiety and Depression - has been taking Remeron multiple years for this. He sees a therapist and they have made some suggestions due to his increased stress and anxiety.   All immunizations and health maintenance protocols were reviewed with the patient and needed orders were placed.  Appropriate screening laboratory values were ordered for the patient including screening of hyperlipidemia, renal function and hepatic function. If indicated by BPH, a PSA was ordered.  Medication reconciliation,  past medical history, social history, problem list and allergies were reviewed in detail with the patient  Goals were established with regard to weight loss, exercise, and  diet in compliance with medications  He had lithotripsy done in November for obstructing kidney stone  He is UTD on colonocopies, dental and vision exams   Review of Systems  Constitutional: Negative.   HENT: Negative.   Eyes: Negative.   Respiratory: Negative.   Cardiovascular: Negative.   Gastrointestinal: Negative.   Endocrine: Negative.   Genitourinary: Negative.   Musculoskeletal: Negative.        Continues to have  intermittent episodes of tendonitis in left arm.   Skin: Negative.   Allergic/Immunologic: Negative.   Neurological: Negative.   Hematological: Negative.   Psychiatric/Behavioral: Negative.   All other systems reviewed and are negative.  Past Medical History:  Diagnosis Date  . Anxiety   . Chronic headaches    stress headaches  . Diverticulitis   . ED (erectile dysfunction)   . Elevated liver enzymes    history being elevated  . History of kidney stones   . Hypertension   . Seasonal allergies     Social History   Socioeconomic History  . Marital status: Single    Spouse name: Not on file  . Number of children: Not on file  . Years of education: Not on file  . Highest education level: Not on file  Occupational History  . Not on file  Social Needs  . Financial resource strain: Not on file  . Food insecurity:    Worry: Not on file    Inability: Not on file  . Transportation needs:    Medical: Not on file    Non-medical: Not on file  Tobacco Use  . Smoking status: Former Research scientist (life sciences)  . Smokeless tobacco: Never Used  Substance and Sexual Activity  . Alcohol use: Yes    Comment: "socially"  . Drug use: No  . Sexual activity: Not on file  Lifestyle  . Physical activity:    Days per week: Not on file    Minutes per session: Not on file  . Stress: Not on file  Relationships  .  Social connections:    Talks on phone: Not on file    Gets together: Not on file    Attends religious service: Not on file    Active member of club or organization: Not on file    Attends meetings of clubs or organizations: Not on file    Relationship status: Not on file  . Intimate partner violence:    Fear of current or ex partner: Not on file    Emotionally abused: Not on file    Physically abused: Not on file    Forced sexual activity: Not on file  Other Topics Concern  . Not on file  Social History Narrative   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, drilling      Not  married - Divorced      Three children - 2 live locally, one in [place]       Past Surgical History:  Procedure Laterality Date  . EXTRACORPOREAL SHOCK WAVE LITHOTRIPSY Left 01/28/2017   Procedure: LEFT EXTRACORPOREAL SHOCK WAVE LITHOTRIPSY (ESWL);  Surgeon: Irine Seal, MD;  Location: [facility];  Service: Urology;  Laterality: Left;  . HERNIA REPAIR     at age 52 years    Family History  Problem Relation Age of Onset  . Breast cancer Mother   . Lung cancer Father   . Prostate cancer Brother 33  . Cancer Paternal Grandfather     No Known Allergies  Current Outpatient Medications on File Prior to Visit  Medication Sig Dispense Refill  . amLODipine-benazepril (LOTREL) 5-20 MG capsule Take 1 capsule by mouth daily. 90 capsule 3  . fluticasone (FLONASE) 50 MCG/ACT nasal spray Place 1 spray into both nostrils daily. 48 g 1  . mirtazapine (REMERON) 7.5 MG tablet Take 1 tablet (7.5 mg total) by mouth at bedtime. 90 tablet 0  . omega-3 acid ethyl esters (LOVAZA) 1 g capsule Take 1 capsule (1 g total) by mouth 3 (three) times daily. 270 capsule 0  . tadalafil (CIALIS) 20 MG tablet Take 20 mg by mouth daily as needed.     No current facility-administered medications on file prior to visit.     BP (!) 142/82 (BP Location: Left Arm)   Temp 98.7 F (37.1 C) (Oral)   Ht 5' 8.75" (1.746 m) Comment: Without Shoes  Wt 189 lb (85.7 kg)   BMI 28.11 kg/m       Objective:   Physical Exam  Constitutional: He is oriented to person, place, and time. He appears well-developed and well-nourished. No distress.  HENT:  Head: Normocephalic and atraumatic.  Right Ear: External ear normal.  Left Ear: External ear normal.  Nose: Nose normal.  Mouth/Throat: Oropharynx is clear and moist. No oropharyngeal exudate.  Eyes: Pupils are equal, round, and reactive to light. Conjunctivae and EOM are normal. Right eye exhibits no discharge. Left eye exhibits no discharge. No scleral icterus.  Neck: Normal  range of motion. Neck supple. No JVD present. No tracheal deviation present. No thyromegaly present.  Cardiovascular: Normal rate, regular rhythm, normal heart sounds and intact distal pulses. Exam reveals no gallop and no friction rub.  No murmur heard. Pulmonary/Chest: Effort normal and breath sounds normal. No stridor. No respiratory distress. He has no wheezes. He has no rales. He exhibits no tenderness.  Abdominal: Soft. Bowel sounds are normal. He exhibits no distension and no mass. There is no tenderness. There is no rebound and no guarding.  Musculoskeletal: Normal range of motion. He exhibits  no edema, tenderness or deformity.  Lymphadenopathy:    He has no cervical adenopathy.  Neurological: He is alert and oriented to person, place, and time. He has normal reflexes. He displays normal reflexes. No cranial nerve deficit. He exhibits normal muscle tone. Coordination normal.  Skin: Skin is warm and dry. No rash noted. He is not diaphoretic. No erythema. No pallor.  Psychiatric: He has a normal mood and affect. His behavior is normal. Judgment and thought content normal.  Nursing note and vitals reviewed.     Assessment & Plan:  1. Routine general medical examination at a health care facility - Continue exercising and eating healthy. Follow up in one year or sooner if needed - Lipid panel - TSH - Comprehensive metabolic panel - CBC with Differential/Platelet  2. Essential hypertension - Have him check his BP at home and send me results.  - amLODipine-benazepril (LOTREL) 5-20 MG capsule; Take 1 capsule by mouth daily.  Dispense: 90 capsule; Refill: 3  3. Mixed hyperlipidemia  - omega-3 acid ethyl esters (LOVAZA) 1 g capsule; Take 1 capsule (1 g total) by mouth 3 (three) times daily.  Dispense: 270 capsule; Refill: 3  4. Adjustment disorder with anxious mood - Will add Zoloft 50 mg to regimen. Reviewed side effects of medication. He will send me a mychart message in one month to  see how he is doing  - mirtazapine (REMERON) 7.5 MG tablet; Take 1 tablet (7.5 mg total) by mouth at bedtime.  Dispense: 90 tablet; Refill: 1 - sertraline (ZOLOFT) 50 MG tablet; Take 1 tablet (50 mg total) by mouth daily.  Dispense: 90 tablet; Refill: 0  5. Prostate cancer screening  - PSA   [name], NP

## 2017-06-15 NOTE — Progress Notes (Signed)
Subjective:    Patient ID: Raymond Johnson, male    DOB: 1965-07-20, 52 y.o.   MRN: [id number]  HPI Patient presents for yearly preventative medicine examination. Overall he is doing well, he recently saw his counselor about anxiety and she has recommendations for a more long-acting medication.  She has suggested Prozac versus Zoloft.  His taking Remeron at night but this is no longer lasting him as long as it previously did.  He is sleeping well with the Remeron.   He is still having some tenderness and pain with his left upper arm when straining.  He had a tendon injury last year and was followed by orthopedics.  He does not want to pursue surgery at this time and would like to discuss other options.   He is having headaches every other day and is using Motrin.  He is drinking water and very limited soda.  He believes part of his headache is from eating smaller meals and is trying to make sure he eats more frequently and is getting adequate water intake.  He also contributes some of the headache to stress and is hopeful that if that is better controlled then the headaches will improve.  Motrin does help abort his headache.   All immunizations and health maintenance protocols were reviewed with the patient and needed orders were placed. He is up to date with colonoscopy, immunizations, eye and dental appointments.   Appropriate screening laboratory values were ordered for the patient including screening of hyperlipidemia, renal function and hepatic function. If indicated by BPH, a PSA was ordered. Wants to defer HIV and Hep C screening today as he is not sure his insurance will cover them.   Medication reconciliation,  past medical history, social history, problem list and allergies were reviewed in detail with the patient. Medication refills needed. He is having issues with dry skin and wants recommendations.  . Goals were established with regard to weight loss, exercise, and  diet in  compliance with medications.  He is eating a balanced diet.  He has cut back on his red meat intake after his kidney stone last fall.  He is eating more chicken, vegetables and fruits.  He has drastically decreased his fast food and processed foods. He is exercising regularly with jogging daily or riding his bike, he also incorporates stretching into his routine.   End of life planning was discussed. Advanced directive information given to patient to look over.    Review of Systems  Constitutional: Negative for appetite change, chills, fatigue, fever and unexpected weight change.  HENT: Negative.   Respiratory: Negative for chest tightness and shortness of breath.   Cardiovascular: Negative for chest pain, palpitations and leg swelling.  Gastrointestinal: Negative for abdominal pain, blood in stool, constipation, diarrhea, nausea and vomiting.  Genitourinary: Negative for difficulty urinating, frequency and urgency.  Musculoskeletal: Positive for myalgias.  Neurological: Positive for headaches. Negative for dizziness, seizures and weakness.   Past Medical History:  Diagnosis Date  . Anxiety   . Chronic headaches    stress headaches  . Diverticulitis   . ED (erectile dysfunction)   . Elevated liver enzymes    history being elevated  . History of kidney stones   . Hypertension   . Seasonal allergies     Social History   Socioeconomic History  . Marital status: Single    Spouse name: Not on file  . Number of children: Not on file  . Years of education: Not  on file  . Highest education level: Not on file  Occupational History  . Not on file  Social Needs  . Financial resource strain: Not on file  . Food insecurity:    Worry: Not on file    Inability: Not on file  . Transportation needs:    Medical: Not on file    Non-medical: Not on file  Tobacco Use  . Smoking status: Former Research scientist (life sciences)  . Smokeless tobacco: Never Used  Substance and Sexual Activity  . Alcohol use: Yes     Comment: "socially"  . Drug use: No  . Sexual activity: Not on file  Lifestyle  . Physical activity:    Days per week: Not on file    Minutes per session: Not on file  . Stress: Not on file  Relationships  . Social connections:    Talks on phone: Not on file    Gets together: Not on file    Attends religious service: Not on file    Active member of club or organization: Not on file    Attends meetings of clubs or organizations: Not on file    Relationship status: Not on file  . Intimate partner violence:    Fear of current or ex partner: Not on file    Emotionally abused: Not on file    Physically abused: Not on file    Forced sexual activity: Not on file  Other Topics Concern  . 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 EXTRACORPOREAL SHOCK WAVE LITHOTRIPSY Left 01/28/2017   Procedure: LEFT EXTRACORPOREAL SHOCK WAVE LITHOTRIPSY (ESWL);  Surgeon: Irine Seal, MD;  Location: [facility];  Service: Urology;  Laterality: Left;  . HERNIA REPAIR     at age 61 years    Family History  Problem Relation Age of Onset  . Breast cancer Mother   . Lung cancer Father   . Prostate cancer Brother 52  . Cancer Paternal Grandfather     No Known Allergies  Current Outpatient Medications on File Prior to Visit  Medication Sig Dispense Refill  . amLODipine-benazepril (LOTREL) 5-20 MG capsule Take 1 capsule by mouth daily. 90 capsule 3  . fluticasone (FLONASE) 50 MCG/ACT nasal spray Place 1 spray into both nostrils daily. 48 g 1  . mirtazapine (REMERON) 7.5 MG tablet Take 1 tablet (7.5 mg total) by mouth at bedtime. 90 tablet 0  . omega-3 acid ethyl esters (LOVAZA) 1 g capsule Take 1 capsule (1 g total) by mouth 3 (three) times daily. 270 capsule 0  . tadalafil (CIALIS) 20 MG tablet Take 20 mg by  mouth daily as needed.     No current facility-administered medications on file prior to visit.     BP (!) 142/82 (BP Location: Left Arm)   Temp 98.7 F (37.1 C) (Oral)   Ht 5' 8.75" (1.746 m) Comment: Without Shoes  Wt 189 lb (85.7 kg)   BMI 28.11 kg/m      Objective:   Physical Exam  Constitutional: He is oriented to person, place, and time. He appears well-developed and well-nourished. No distress.  HENT:  Head: Normocephalic and atraumatic.  Right Ear: External ear normal.  Left Ear: External ear normal.  Nose:  Mucosal edema present. No rhinorrhea.  Mouth/Throat: Oropharynx is clear and moist. No oropharyngeal exudate.  Erythematous nasal turbinate  Eyes: Pupils are equal, round, and reactive to light. EOM are normal. Right eye exhibits no discharge. Left eye exhibits no discharge.  Neck: Normal range of motion. Neck supple. No JVD present. Carotid bruit is not present. No thyromegaly present.  Cardiovascular: Normal rate, regular rhythm, normal heart sounds and intact distal pulses. Exam reveals no gallop and no friction rub.  No murmur heard. Pulmonary/Chest: Effort normal and breath sounds normal. No respiratory distress. He has no wheezes. He has no rales.  Abdominal: Soft. Bowel sounds are normal. He exhibits no distension and no mass. There is no hepatosplenomegaly. There is no tenderness. There is no rebound and no guarding.  Musculoskeletal: Normal range of motion. He exhibits no edema, tenderness or deformity.  Lymphadenopathy:    He has no cervical adenopathy.  Neurological: He is alert and oriented to person, place, and time. He has normal reflexes. He exhibits normal muscle tone. Coordination normal.  Skin: Skin is warm and dry. No rash noted. He is not diaphoretic. No erythema. No pallor.  Psychiatric: He has a normal mood and affect. His behavior is normal. Judgment and thought content normal.  Nursing note and vitals reviewed.     Assessment & Plan:  1.  Routine general medical examination at a health care facility Continue to follow current diet and exercise.  Continued exercise will benefit your blood pressure. Will check labs and notify of results.  - Lipid panel - TSH - Comprehensive metabolic panel - CBC with Differential/Platelet  2. Essential hypertension Blood pressure is better controlled today.  - amLODipine-benazepril (LOTREL) 5-20 MG capsule; Take 1 capsule by mouth daily.  Dispense: 90 capsule; Refill: 3  3. Mixed hyperlipidemia Will check lab work and notify of results.   - omega-3 acid ethyl esters (LOVAZA) 1 g capsule; Take 1 capsule (1 g total) by mouth 3 (three) times daily.  Dispense: 270 capsule; Refill: 3  4. Adjustment disorder with anxious mood Increasing stress in life.  Will start Zoloft.  Notify us in 1 month of how you're doing. May need to increase to get best effect.  - mirtazapine (REMERON) 7.5 MG tablet; Take 1 tablet (7.5 mg total) by mouth at bedtime.  Dispense: 90 tablet; Refill: 1 - sertraline (ZOLOFT) 50 MG tablet; Take 1 tablet (50 mg total) by mouth daily.  Dispense: 90 tablet; Refill: 0  5. Prostate cancer screening Will check lab work and notify of results.  - PSA  Follow up as needed.  Cerave SA Cream for dry skin.  Juniel Groene C Keshun Berrett BSN RN NP student

## 2017-06-21 ENCOUNTER — Ambulatory Visit (INDEPENDENT_AMBULATORY_CARE_PROVIDER_SITE_OTHER): Payer: 59 | Admitting: Licensed Clinical Social Worker

## 2017-06-21 DIAGNOSIS — F329 Major depressive disorder, single episode, unspecified: Secondary | ICD-10-CM | POA: Diagnosis not present

## 2017-06-29 ENCOUNTER — Ambulatory Visit: Payer: 59 | Admitting: Licensed Clinical Social Worker

## 2017-07-22 ENCOUNTER — Ambulatory Visit (INDEPENDENT_AMBULATORY_CARE_PROVIDER_SITE_OTHER): Payer: 59 | Admitting: Licensed Clinical Social Worker

## 2017-07-22 DIAGNOSIS — F324 Major depressive disorder, single episode, in partial remission: Secondary | ICD-10-CM | POA: Diagnosis not present

## 2017-08-04 ENCOUNTER — Ambulatory Visit: Payer: 59 | Admitting: Licensed Clinical Social Worker

## 2017-08-12 ENCOUNTER — Encounter: Payer: Self-pay | Admitting: Adult Health

## 2017-09-14 ENCOUNTER — Encounter: Payer: Self-pay | Admitting: Adult Health

## 2017-09-14 DIAGNOSIS — F4322 Adjustment disorder with anxiety: Secondary | ICD-10-CM

## 2017-09-14 MED ORDER — SERTRALINE HCL 50 MG PO TABS: 50.0000 mg | ORAL_TABLET | Freq: Every day | ORAL | 2 refills | Status: DC

## 2017-11-13 ENCOUNTER — Other Ambulatory Visit: Payer: Self-pay | Admitting: Adult Health

## 2017-11-14 ENCOUNTER — Encounter: Payer: Self-pay | Admitting: Adult Health

## 2017-11-15 NOTE — Telephone Encounter (Signed)
Hello Raymond Johnson,    Would you please call [facility] [phone] and fill my Cialis script?     Thank you,    Chiquita Loth, please advise if ok to refill medication.  Thanks

## 2017-11-16 ENCOUNTER — Other Ambulatory Visit: Payer: Self-pay | Admitting: Adult Health

## 2017-11-16 MED ORDER — TADALAFIL 20 MG PO TABS: 20.0000 mg | ORAL_TABLET | Freq: Every day | ORAL | 3 refills | Status: DC | PRN

## 2017-11-16 NOTE — Telephone Encounter (Signed)
This prescription was filled on 11/16/17.  Request denied.

## 2017-12-24 ENCOUNTER — Other Ambulatory Visit: Payer: Self-pay | Admitting: Adult Health

## 2017-12-24 ENCOUNTER — Encounter: Payer: Self-pay | Admitting: Adult Health

## 2017-12-24 DIAGNOSIS — F4322 Adjustment disorder with anxiety: Secondary | ICD-10-CM

## 2017-12-24 NOTE — Telephone Encounter (Signed)
Sent to the pharmacy by e-scribe. 

## 2018-01-03 ENCOUNTER — Ambulatory Visit (INDEPENDENT_AMBULATORY_CARE_PROVIDER_SITE_OTHER): Payer: 59 | Admitting: Licensed Clinical Social Worker

## 2018-01-03 DIAGNOSIS — F324 Major depressive disorder, single episode, in partial remission: Secondary | ICD-10-CM

## 2018-01-11 ENCOUNTER — Ambulatory Visit (INDEPENDENT_AMBULATORY_CARE_PROVIDER_SITE_OTHER): Payer: 59

## 2018-01-11 DIAGNOSIS — Z23 Encounter for immunization: Secondary | ICD-10-CM | POA: Diagnosis not present

## 2018-02-08 ENCOUNTER — Encounter: Payer: Self-pay | Admitting: Adult Health

## 2018-02-08 MED ORDER — FLUTICASONE PROPIONATE 50 MCG/ACT NA SUSP: 1.0000 | Freq: Every day | NASAL | 1 refills | Status: DC

## 2018-02-15 ENCOUNTER — Ambulatory Visit (INDEPENDENT_AMBULATORY_CARE_PROVIDER_SITE_OTHER): Payer: 59 | Admitting: Licensed Clinical Social Worker

## 2018-02-15 DIAGNOSIS — F324 Major depressive disorder, single episode, in partial remission: Secondary | ICD-10-CM | POA: Diagnosis not present

## 2018-04-21 ENCOUNTER — Ambulatory Visit: Payer: 59 | Admitting: Licensed Clinical Social Worker

## 2018-04-28 ENCOUNTER — Ambulatory Visit (INDEPENDENT_AMBULATORY_CARE_PROVIDER_SITE_OTHER): Payer: 59 | Admitting: Licensed Clinical Social Worker

## 2018-04-28 DIAGNOSIS — F324 Major depressive disorder, single episode, in partial remission: Secondary | ICD-10-CM

## 2018-05-11 DIAGNOSIS — C44612 Basal cell carcinoma of skin of right upper limb, including shoulder: Secondary | ICD-10-CM | POA: Diagnosis not present

## 2018-05-11 DIAGNOSIS — L821 Other seborrheic keratosis: Secondary | ICD-10-CM | POA: Diagnosis not present

## 2018-05-11 DIAGNOSIS — L816 Other disorders of diminished melanin formation: Secondary | ICD-10-CM | POA: Diagnosis not present

## 2018-05-11 DIAGNOSIS — L819 Disorder of pigmentation, unspecified: Secondary | ICD-10-CM | POA: Diagnosis not present

## 2018-05-11 DIAGNOSIS — I83899 Varicose veins of unspecified lower extremities with other complications: Secondary | ICD-10-CM | POA: Diagnosis not present

## 2018-05-31 ENCOUNTER — Ambulatory Visit: Payer: 59 | Admitting: Licensed Clinical Social Worker

## 2018-06-02 DIAGNOSIS — C44519 Basal cell carcinoma of skin of other part of trunk: Secondary | ICD-10-CM | POA: Diagnosis not present

## 2018-06-09 ENCOUNTER — Ambulatory Visit (INDEPENDENT_AMBULATORY_CARE_PROVIDER_SITE_OTHER): Payer: 59 | Admitting: Licensed Clinical Social Worker

## 2018-06-09 DIAGNOSIS — F324 Major depressive disorder, single episode, in partial remission: Secondary | ICD-10-CM | POA: Diagnosis not present

## 2018-06-15 ENCOUNTER — Encounter: Payer: Self-pay | Admitting: Adult Health

## 2018-06-15 DIAGNOSIS — I10 Essential (primary) hypertension: Secondary | ICD-10-CM

## 2018-06-15 DIAGNOSIS — F4322 Adjustment disorder with anxiety: Secondary | ICD-10-CM

## 2018-06-15 MED ORDER — AMLODIPINE BESY-BENAZEPRIL HCL 5-20 MG PO CAPS: 1.0000 | ORAL_CAPSULE | Freq: Every day | ORAL | 0 refills | Status: DC

## 2018-06-15 MED ORDER — SERTRALINE HCL 50 MG PO TABS: 50.0000 mg | ORAL_TABLET | Freq: Every day | ORAL | 0 refills | Status: DC

## 2018-06-15 MED ORDER — MIRTAZAPINE 7.5 MG PO TABS: 7.5000 mg | ORAL_TABLET | Freq: Every day | ORAL | 0 refills | Status: DC

## 2018-06-15 NOTE — Telephone Encounter (Signed)
Medications with the exception of fish oil sent to the pharmacy by e-scribe for 90 days.

## 2018-06-17 ENCOUNTER — Encounter: Payer: Self-pay | Admitting: Adult Health

## 2018-07-12 ENCOUNTER — Ambulatory Visit: Payer: 59 | Admitting: Licensed Clinical Social Worker

## 2018-07-13 ENCOUNTER — Ambulatory Visit: Payer: 59 | Admitting: Licensed Clinical Social Worker

## 2018-08-16 ENCOUNTER — Other Ambulatory Visit: Payer: Self-pay | Admitting: Adult Health

## 2018-08-16 DIAGNOSIS — E782 Mixed hyperlipidemia: Secondary | ICD-10-CM

## 2018-08-17 MED ORDER — OMEGA-3-ACID ETHYL ESTERS 1 G PO CAPS: 1.0000 g | ORAL_CAPSULE | Freq: Three times a day (TID) | ORAL | 0 refills | Status: DC

## 2018-08-17 NOTE — Telephone Encounter (Signed)
Sent to the pharmacy by e-scribe for 90 days. 

## 2018-08-25 ENCOUNTER — Encounter: Payer: Self-pay | Admitting: Adult Health

## 2018-08-25 ENCOUNTER — Ambulatory Visit (INDEPENDENT_AMBULATORY_CARE_PROVIDER_SITE_OTHER): Payer: 59 | Admitting: Adult Health

## 2018-08-25 ENCOUNTER — Other Ambulatory Visit: Payer: Self-pay

## 2018-08-25 VITALS — BP 134/76 | Temp 98.2°F | Ht 69.5 in | Wt 187.0 lb

## 2018-08-25 DIAGNOSIS — I10 Essential (primary) hypertension: Secondary | ICD-10-CM | POA: Diagnosis not present

## 2018-08-25 DIAGNOSIS — Z125 Encounter for screening for malignant neoplasm of prostate: Secondary | ICD-10-CM

## 2018-08-25 DIAGNOSIS — Z0001 Encounter for general adult medical examination with abnormal findings: Secondary | ICD-10-CM | POA: Diagnosis not present

## 2018-08-25 DIAGNOSIS — E782 Mixed hyperlipidemia: Secondary | ICD-10-CM | POA: Diagnosis not present

## 2018-08-25 DIAGNOSIS — Z0000 Encounter for general adult medical examination without abnormal findings: Secondary | ICD-10-CM

## 2018-08-25 DIAGNOSIS — F4322 Adjustment disorder with anxiety: Secondary | ICD-10-CM | POA: Diagnosis not present

## 2018-08-25 DIAGNOSIS — R0683 Snoring: Secondary | ICD-10-CM

## 2018-08-25 LAB — TSH: TSH: 1.8 u[IU]/mL (ref 0.35–4.50)

## 2018-08-25 LAB — CBC WITH DIFFERENTIAL/PLATELET
Basophils Absolute: 0 10*3/uL (ref 0.0–0.1)
Basophils Relative: 0.9 % (ref 0.0–3.0)
Eosinophils Absolute: 0.1 10*3/uL (ref 0.0–0.7)
Eosinophils Relative: 2.8 % (ref 0.0–5.0)
HCT: 47 % (ref 39.0–52.0)
Hemoglobin: 16.3 g/dL (ref 13.0–17.0)
Lymphocytes Relative: 22.8 % (ref 12.0–46.0)
Lymphs Abs: 0.9 10*3/uL (ref 0.7–4.0)
MCHC: 34.8 g/dL (ref 30.0–36.0)
MCV: 99.5 fl (ref 78.0–100.0)
Monocytes Absolute: 0.4 10*3/uL (ref 0.1–1.0)
Monocytes Relative: 11.7 % (ref 3.0–12.0)
Neutro Abs: 2.4 10*3/uL (ref 1.4–7.7)
Neutrophils Relative %: 61.8 % (ref 43.0–77.0)
Platelets: 131 10*3/uL — ABNORMAL LOW (ref 150.0–400.0)
RBC: 4.72 Mil/uL (ref 4.22–5.81)
RDW: 13 % (ref 11.5–15.5)
WBC: 3.8 10*3/uL — ABNORMAL LOW (ref 4.0–10.5)

## 2018-08-25 LAB — COMPREHENSIVE METABOLIC PANEL
ALT: 57 U/L — ABNORMAL HIGH (ref 0–53)
AST: 46 U/L — ABNORMAL HIGH (ref 0–37)
Albumin: 4.6 g/dL (ref 3.5–5.2)
Alkaline Phosphatase: 67 U/L (ref 39–117)
BUN: 13 mg/dL (ref 6–23)
CO2: 28 mEq/L (ref 19–32)
Calcium: 9.3 mg/dL (ref 8.4–10.5)
Chloride: 105 mEq/L (ref 96–112)
Creatinine, Ser: 1.02 mg/dL (ref 0.40–1.50)
GFR: 75.84 mL/min (ref >60.00)
Glucose, Bld: 101 mg/dL — ABNORMAL HIGH (ref 70–99)
Potassium: 4.2 mEq/L (ref 3.5–5.1)
Sodium: 141 mEq/L (ref 135–145)
Total Bilirubin: 0.7 mg/dL (ref 0.2–1.2)
Total Protein: 7 g/dL (ref 6.0–8.3)

## 2018-08-25 LAB — LIPID PANEL
Cholesterol: 225 mg/dL — ABNORMAL HIGH (ref 0–200)
HDL: 81.5 mg/dL (ref >39.00)
LDL Cholesterol: 131 mg/dL — ABNORMAL HIGH (ref 0–99)
NonHDL: 143.95
Total CHOL/HDL Ratio: 3
Triglycerides: 66 mg/dL (ref 0.0–149.0)
VLDL: 13.2 mg/dL (ref 0.0–40.0)

## 2018-08-25 LAB — PSA: PSA: 0.67 ng/mL (ref 0.10–4.00)

## 2018-08-25 NOTE — Progress Notes (Signed)
Subjective:    Patient ID: Raymond Johnson, male    DOB: 1965/11/09, 53 y.o.   MRN: [id number]  HPI Patient presents for yearly preventative medicine examination. Pleasant 53 year old male who  has a past medical history of Anxiety, Chronic headaches, Diverticulitis, ED (erectile dysfunction), Elevated liver enzymes, History of kidney stones, Hypertension, and Seasonal allergies.  Anxiety -participates in dual therapy with a counselor as well as taking Zoloft. Feels as though anxiety is well controlled.   Insomnia - Takes Remeron 7.5 mg - feels as though he is sleeping well.   Essential Hypertension - takes Lotrel 5-20.  BP Readings from Last 3 Encounters:  08/25/18 134/76  06/15/17 (!) 142/82  01/28/17 (!) 157/93   Hyperlipidemia -currently taking Lovaza 1 g 3 times daily Lab Results  Component Value Date   CHOL 226 (H) 06/15/2017   HDL 73.60 06/15/2017   LDLCALC 126 (H) 06/15/2017   TRIG 130.0 06/15/2017   CHOLHDL 3 06/15/2017   Snoring- reports that his girlfriend tells him he snores " very loud". He doesn't always feel as though he gets restful sleep but does not find himself feeling fatigued during the day or waking up gasping for breath.   All immunizations and health maintenance protocols were reviewed with the patient and needed orders were placed. UTD  Appropriate screening laboratory values were ordered for the patient including screening of hyperlipidemia, renal function and hepatic function. If indicated by BPH, a PSA was ordered.  Medication reconciliation,  past medical history, social history, problem list and allergies were reviewed in detail with the patient  Goals were established with regard to weight loss, exercise, and diet in compliance with medications. He is eating healthy, not eating a lot of red meat and is walking/running/ bike riding multiple times a week Wt Readings from Last 3 Encounters:  08/25/18 187 lb (84.8 kg)  06/15/17 189 lb (85.7 kg)   01/28/17 182 lb 9.6 oz (82.8 kg)   He is up-to-date on routine colonoscopy, next being in 2026.  He has participated in routine dental and vision care over the last year   Review of Systems  Constitutional: Negative.   HENT: Negative.   Eyes: Negative.   Respiratory: Negative.   Cardiovascular: Negative.   Gastrointestinal: Negative.   Endocrine: Negative.   Genitourinary: Negative.   Musculoskeletal: Negative.   Skin: Negative.   Allergic/Immunologic: Negative.   Neurological: Negative.   Hematological: Negative.   Psychiatric/Behavioral: Negative.   All other systems reviewed and are negative.  Past Medical History:  Diagnosis Date  . Anxiety   . Chronic headaches    stress headaches  . Diverticulitis   . ED (erectile dysfunction)   . Elevated liver enzymes    history being elevated  . History of kidney stones   . Hypertension   . Seasonal allergies     Social History   Socioeconomic History  . Marital status: Single    Spouse name: Not on file  . Number of children: Not on file  . Years of education: Not on file  . Highest education level: Not on file  Occupational History  . Not on file  Social Needs  . Financial resource strain: Not on file  . Food insecurity:    Worry: Not on file    Inability: Not on file  . Transportation needs:    Medical: Not on file    Non-medical: Not on file  Tobacco Use  . Smoking status: Former Research scientist (life sciences)  .  Smokeless tobacco: Never Used  Substance and Sexual Activity  . Alcohol use: Yes    Comment: "socially"  . Drug use: No  . Sexual activity: Not on file  Lifestyle  . Physical activity:    Days per week: Not on file    Minutes per session: Not on file  . Stress: Not on file  Relationships  . Social connections:    Talks on phone: Not on file    Gets together: Not on file    Attends religious service: Not on file    Active member of club or organization: Not on file    Attends meetings of clubs or organizations:  Not on file    Relationship status: Not on file  . Intimate partner violence:    Fear of current or ex partner: Not on file    Emotionally abused: Not on file    Physically abused: Not on file    Forced sexual activity: Not on file  Other Topics Concern  . 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 EXTRACORPOREAL SHOCK WAVE LITHOTRIPSY Left 01/28/2017   Procedure: LEFT EXTRACORPOREAL SHOCK WAVE LITHOTRIPSY (ESWL);  Surgeon: Irine Seal, MD;  Location: [facility];  Service: Urology;  Laterality: Left;  . HERNIA REPAIR     at age 58 years    Family History  Problem Relation Age of Onset  . Breast cancer Mother   . Lung cancer Father   . Prostate cancer Brother 54  . Cancer Paternal Grandfather     No Known Allergies  Current Outpatient Medications on File Prior to Visit  Medication Sig Dispense Refill  . amLODipine-benazepril (LOTREL) 5-20 MG capsule Take 1 capsule by mouth daily. 90 capsule 0  . fluticasone (FLONASE) 50 MCG/ACT nasal spray Place 1 spray into both nostrils daily. 48 g 1  . mirtazapine (REMERON) 7.5 MG tablet Take 1 tablet (7.5 mg total) by mouth at bedtime. 90 tablet 0  . omega-3 acid ethyl esters (LOVAZA) 1 g capsule Take 1 capsule (1 g total) by mouth 3 (three) times daily. 270 capsule 0  . sertraline (ZOLOFT) 50 MG tablet Take 1 tablet (50 mg total) by mouth daily. 90 tablet 0  . tadalafil (ADCIRCA/CIALIS) 20 MG tablet Take 1 tablet (20 mg total) by mouth daily as needed. 10 tablet 3   No current facility-administered medications on file prior to visit.     BP 134/76   Temp 98.2 F (36.8 C)   Ht 5' 9.5" (1.765 m)   Wt 187 lb (84.8 kg)   BMI 27.22 kg/m       Objective:   Physical Exam Vitals signs and nursing note reviewed.  Constitutional:       General: He is not in acute distress.    Appearance: He is well-developed. He is not diaphoretic.  HENT:     Head: Normocephalic and atraumatic.     Right Ear: External ear normal.     Left Ear: External ear normal.     Nose: Nose normal.     Mouth/Throat:     Pharynx: No oropharyngeal exudate.  Eyes:     General:        Right eye: No discharge.  Left eye: No discharge.     Conjunctiva/sclera: Conjunctivae normal.     Pupils: Pupils are equal, round, and reactive to light.  Neck:     Thyroid: No thyromegaly.     Trachea: No tracheal deviation.  Cardiovascular:     Rate and Rhythm: Normal rate and regular rhythm.     Heart sounds: Normal heart sounds. No murmur. No friction rub. No gallop.   Pulmonary:     Effort: Pulmonary effort is normal. No respiratory distress.     Breath sounds: Normal breath sounds. No wheezing or rales.  Chest:     Chest wall: No tenderness.  Abdominal:     General: Bowel sounds are normal. There is no distension.     Palpations: Abdomen is soft.     Tenderness: There is no abdominal tenderness. There is no guarding or rebound.  Musculoskeletal: Normal range of motion.  Lymphadenopathy:     Cervical: No cervical adenopathy.  Skin:    General: Skin is warm and dry.     Coloration: Skin is not pale.     Findings: No erythema or rash.  Neurological:     Mental Status: He is alert and oriented to person, place, and time.     Cranial Nerves: No cranial nerve deficit.     Coordination: Coordination normal.  Psychiatric:        Thought Content: Thought content normal.        Judgment: Judgment normal.       Assessment & Plan:  1. Routine general medical examination at a health care facility - Continue to exercise and eat healthy.  - Follow up in one year or sooner if needed - CBC with Differential/Platelet - Comprehensive metabolic panel - Lipid panel - TSH  2. Adjustment disorder with anxious mood - Well controlled. No change in  medications  - CBC with Differential/Platelet - Comprehensive metabolic panel - Lipid panel - TSH  3. Essential hypertension - No change in medications - CBC with Differential/Platelet - Comprehensive metabolic panel - Lipid panel - TSH  4. Mixed hyperlipidemia - Consider statin  - CBC with Differential/Platelet - Comprehensive metabolic panel - Lipid panel - TSH  5. Prostate cancer screening  - PSA  6. Snoring - Will refer to pulmonary for potential  sleep study  - Ambulatory referral to Pulmonology  [name], NP

## 2018-08-31 ENCOUNTER — Encounter: Payer: Self-pay | Admitting: Adult Health

## 2018-09-08 ENCOUNTER — Encounter: Payer: Self-pay | Admitting: Adult Health

## 2018-09-20 ENCOUNTER — Other Ambulatory Visit: Payer: Self-pay | Admitting: Adult Health

## 2018-09-20 ENCOUNTER — Encounter: Payer: Self-pay | Admitting: Adult Health

## 2018-09-20 DIAGNOSIS — F4322 Adjustment disorder with anxiety: Secondary | ICD-10-CM

## 2018-09-20 DIAGNOSIS — I10 Essential (primary) hypertension: Secondary | ICD-10-CM

## 2018-09-20 MED ORDER — AMLODIPINE BESY-BENAZEPRIL HCL 5-20 MG PO CAPS: 1.0000 | ORAL_CAPSULE | Freq: Every day | ORAL | 3 refills | Status: DC

## 2018-09-20 MED ORDER — MIRTAZAPINE 7.5 MG PO TABS: 7.5000 mg | ORAL_TABLET | Freq: Every day | ORAL | 1 refills | Status: DC

## 2018-09-20 MED ORDER — SERTRALINE HCL 50 MG PO TABS: 50.0000 mg | ORAL_TABLET | Freq: Every day | ORAL | 1 refills | Status: DC

## 2018-09-20 NOTE — Telephone Encounter (Signed)
Sent to the pharmacy by e-scribe. 

## 2018-10-09 ENCOUNTER — Encounter: Payer: Self-pay | Admitting: Adult Health

## 2018-10-27 ENCOUNTER — Encounter: Payer: Self-pay | Admitting: Adult Health

## 2018-11-02 ENCOUNTER — Other Ambulatory Visit: Payer: Self-pay | Admitting: Adult Health

## 2018-11-02 DIAGNOSIS — E782 Mixed hyperlipidemia: Secondary | ICD-10-CM

## 2018-11-04 ENCOUNTER — Encounter: Payer: Self-pay | Admitting: Adult Health

## 2018-11-08 ENCOUNTER — Telehealth: Payer: 59 | Admitting: Adult Health

## 2018-11-08 MED ORDER — OMEGA-3-ACID ETHYL ESTERS 1 G PO CAPS: 1.0000 g | ORAL_CAPSULE | Freq: Three times a day (TID) | ORAL | 3 refills | Status: DC

## 2018-11-08 NOTE — Telephone Encounter (Signed)
Medication sent to the pharmacy by refill request.

## 2018-11-08 NOTE — Telephone Encounter (Signed)
Sent to the pharmacy by e-scribe. 

## 2018-11-16 ENCOUNTER — Encounter: Payer: Self-pay | Admitting: Pulmonary Disease

## 2018-11-16 ENCOUNTER — Other Ambulatory Visit: Payer: Self-pay

## 2018-11-16 ENCOUNTER — Ambulatory Visit: Payer: 59 | Admitting: Pulmonary Disease

## 2018-11-16 DIAGNOSIS — R0683 Snoring: Secondary | ICD-10-CM

## 2018-11-16 DIAGNOSIS — G4733 Obstructive sleep apnea (adult) (pediatric): Secondary | ICD-10-CM | POA: Insufficient documentation

## 2018-11-16 DIAGNOSIS — Z23 Encounter for immunization: Secondary | ICD-10-CM

## 2018-11-16 NOTE — Progress Notes (Signed)
Subjective:    Patient ID: Raymond Johnson, male    DOB: [birth year], 53 y.o.   MRN: [id number]  HPI   Chief Complaint  Patient presents with  . 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  This is been noted by his girlfriend.  She has not mentioned witnessed apneas or leg jerks.  He denies excessive daytime somnolence or fatigue. In fact Epworth sleepiness score is 1 He has an active lifestyle and runs for about 20 minutes daily in the afternoons.  His weight has mostly been unchanged in the 1 90-1 95 range over the past few years. Bedtime is around 10 PM, he generally falls asleep watching TV on the couch in the living room and then gets up and goes to bed upstairs.  He denies frequent arousals or nocturia.  He is out of bed by 7 AM feeling rested without dryness of mouth or headaches.  On the weekends he will stay in bed until 8 AM.  He generally sleeps on his side.  There is no history suggestive of cataplexy, sleep paralysis or parasomnias  He drinks alcohol socially this does not seem to impact his sleep.  He quit smoking in 2014. Reports perennial nasal stuffiness and congestion for which he takes Flonase, has never seen an ENT and has never had nasal injuries    Past Medical History:  Diagnosis Date  . Anxiety   . Chronic headaches    stress headaches  . Diverticulitis   . ED (erectile dysfunction)   . Elevated liver enzymes    history being elevated  . History of kidney stones   . Hypertension   . Seasonal allergies      Past Surgical History:  Procedure Laterality Date  . EXTRACORPOREAL SHOCK WAVE LITHOTRIPSY Left 01/28/2017   Procedure: LEFT EXTRACORPOREAL SHOCK WAVE LITHOTRIPSY (ESWL);  Surgeon: Irine Seal, MD;  Location: [facility];  Service: Urology;  Laterality: Left;  . HERNIA REPAIR     at age 64 years    No Known Allergies  Social History   Socioeconomic History   . Marital status: Single    Spouse name: Not on file  . Number of children: Not on file  . Years of education: Not on file  . Highest education level: Not on file  Occupational History  . Not on file  Social Needs  . Financial resource strain: Not on file  . Food insecurity    Worry: Not on file    Inability: Not on file  . Transportation needs    Medical: Not on file    Non-medical: Not on file  Tobacco Use  . Smoking status: Former Smoker    Packs/day: 0.25    Years: 31.00    Pack years: 7.75    Types: Cigarettes    Start date: 40    Quit date: 2014    Years since quitting: 6.6  . Smokeless tobacco: Never Used  . Tobacco comment: quit from age 34 for 4 years   Substance and Sexual Activity  . Alcohol use: Yes    Comment: "socially"  . Drug use: No  . Sexual activity: Not on file  Lifestyle  . Physical activity    Days per week: Not on file    Minutes per session: Not on file  . Stress: Not on file  Relationships  . Social connections    Talks  on phone: Not on file    Gets together: Not on file    Attends religious service: Not on file    Active member of club or organization: Not on file    Attends meetings of clubs or organizations: Not on file    Relationship status: Not on file  . Intimate partner violence    Fear of current or ex partner: Not on file    Emotionally abused: Not on file    Physically abused: Not on file    Forced sexual activity: Not on file  Other Topics Concern  . 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 Breast cancer Mother   . Lung cancer Father   . Prostate cancer Brother 48  . Cancer Paternal Grandfather     Review of Systems  Constitutional: Negative for fever and unexpected weight change.  HENT: Negative for congestion, dental  problem, ear pain, nosebleeds, postnasal drip, rhinorrhea, sinus pressure, sneezing, sore throat and trouble swallowing.   Eyes: Negative for redness and itching.  Respiratory: Negative for cough, chest tightness, shortness of breath and wheezing.   Cardiovascular: Negative for palpitations and leg swelling.  Gastrointestinal: Negative for nausea and vomiting.  Genitourinary: Negative for dysuria.  Musculoskeletal: Negative for joint swelling.  Skin: Negative for rash.  Allergic/Immunologic: Negative.  Negative for environmental allergies, food allergies and immunocompromised state.  Neurological: Negative for headaches.  Hematological: Does not bruise/bleed easily.  Psychiatric/Behavioral: Negative for dysphoric mood. The patient is not nervous/anxious.        Objective:   Physical Exam  Gen. Pleasant, well-nourished, in no distress, normal affect ENT - no pallor,icterus, no post nasal drip, overbite Neck: No JVD, no thyromegaly, no carotid bruits Lungs: no use of accessory muscles, no dullness to percussion, clear without rales or rhonchi  Cardiovascular: Rhythm regular, heart sounds  normal, no murmurs or gallops, no peripheral edema Abdomen: soft and non-tender, no hepatosplenomegaly, BS normal. Musculoskeletal: No deformities, no cyanosis or clubbing Neuro:  alert, non focal       Assessment & Plan:

## 2018-11-16 NOTE — Patient Instructions (Signed)
Schedule home sleep study We discussed treatment options for sleep apnea and snoring

## 2018-11-16 NOTE — Assessment & Plan Note (Signed)
Given  loud snoring, obstructive sleep apnea is possible & an overnight polysomnogram will be scheduled as a home study. The pathophysiology of obstructive sleep apnea , it's cardiovascular consequences & modes of treatment including CPAP were discused with the patient in detail & they evidenced understanding. The lack of excessive daytime somnolence is reassuring.  He does have an overbite and may benefit from a dental appliance if needed He is already using nasal steroids and this has not helped with snoring

## 2019-01-03 ENCOUNTER — Ambulatory Visit (INDEPENDENT_AMBULATORY_CARE_PROVIDER_SITE_OTHER): Payer: 59 | Admitting: Licensed Clinical Social Worker

## 2019-01-03 DIAGNOSIS — F324 Major depressive disorder, single episode, in partial remission: Secondary | ICD-10-CM | POA: Diagnosis not present

## 2019-01-12 ENCOUNTER — Telehealth: Payer: Self-pay | Admitting: Pulmonary Disease

## 2019-01-12 DIAGNOSIS — R0683 Snoring: Secondary | ICD-10-CM

## 2019-01-12 NOTE — Telephone Encounter (Signed)
Raymond Johnson has the order to do the precert we will schedule once precert is obtained

## 2019-01-12 NOTE — Telephone Encounter (Signed)
I don't see that the HST order was placed when the patient was seen back in August. Please put in the HST order

## 2019-01-12 NOTE — Telephone Encounter (Signed)
Called and spoke to pt. Apologized we have not contacted pt to schedule HST. Order was not placed at the time of the visit. New order placed for HST. Pt verbalized understanding.   Will forward to [place] to schedule HST.

## 2019-01-13 ENCOUNTER — Telehealth: Payer: Self-pay | Admitting: Pulmonary Disease

## 2019-01-16 NOTE — Telephone Encounter (Signed)
Order was placed on 10/22.  Called pt to tell him once order is place it needs to be precerted and then we call pt's in order that order is placed.  He states he was seen in August.  I checked and pt was seen on 8/26 and RA noted that pt should be scheduled for hst.  I apologized and told him once this is precerted we will be calling him to get scheduled.  He states ok.  Suanne Marker is working on this precert and sent her a message to note on the order that needs to be scheduled once precerted.

## 2019-01-25 ENCOUNTER — Ambulatory Visit: Payer: 59 | Admitting: Licensed Clinical Social Worker

## 2019-01-26 ENCOUNTER — Other Ambulatory Visit: Payer: Self-pay

## 2019-01-26 ENCOUNTER — Ambulatory Visit: Payer: 59

## 2019-01-26 DIAGNOSIS — R0683 Snoring: Secondary | ICD-10-CM

## 2019-01-26 DIAGNOSIS — G4733 Obstructive sleep apnea (adult) (pediatric): Secondary | ICD-10-CM | POA: Diagnosis not present

## 2019-01-27 ENCOUNTER — Telehealth: Payer: Self-pay | Admitting: Pulmonary Disease

## 2019-01-27 DIAGNOSIS — G4733 Obstructive sleep apnea (adult) (pediatric): Secondary | ICD-10-CM | POA: Diagnosis not present

## 2019-01-27 NOTE — Telephone Encounter (Signed)
HST showed moderate OSA -this was worse in the supine position-average 16/hour. We could try oral appliance but would suggest CPAP first If he is agreeable, auto CPAP 5 to 15 cm, mask of choice Office visit with me/APP in 6 weeks

## 2019-01-31 ENCOUNTER — Encounter: Payer: Self-pay | Admitting: Adult Health

## 2019-01-31 MED ORDER — FLUTICASONE PROPIONATE 50 MCG/ACT NA SUSP: 1.0000 | Freq: Every day | NASAL | 1 refills | Status: DC

## 2019-02-03 NOTE — Telephone Encounter (Signed)
Called the patient to make him aware of the results. The patient stated he had questions and want to do a little research before deciding on a CPAP machine.  I told the patient he can come in to the clinic for a face to face visit in order to have his questions answered. The patient agreed. He has been scheduled for 02/08/19 at 2:00 with Derl Barrow, NP. Nothing further needed at this time.

## 2019-02-08 ENCOUNTER — Ambulatory Visit: Payer: 59 | Admitting: Primary Care

## 2019-02-22 ENCOUNTER — Ambulatory Visit (INDEPENDENT_AMBULATORY_CARE_PROVIDER_SITE_OTHER): Payer: 59 | Admitting: Licensed Clinical Social Worker

## 2019-02-22 DIAGNOSIS — F324 Major depressive disorder, single episode, in partial remission: Secondary | ICD-10-CM | POA: Diagnosis not present

## 2019-02-27 ENCOUNTER — Encounter: Payer: Self-pay | Admitting: Pulmonary Disease

## 2019-02-27 ENCOUNTER — Other Ambulatory Visit: Payer: Self-pay

## 2019-02-27 ENCOUNTER — Ambulatory Visit (INDEPENDENT_AMBULATORY_CARE_PROVIDER_SITE_OTHER): Payer: 59 | Admitting: Pulmonary Disease

## 2019-02-27 DIAGNOSIS — G4733 Obstructive sleep apnea (adult) (pediatric): Secondary | ICD-10-CM | POA: Diagnosis not present

## 2019-02-27 NOTE — Progress Notes (Signed)
I connected with  Raymond Johnson on 02/27/19 by phone and verified that I am speaking with the correct person using two identifiers.   I discussed the limitations of evaluation and management by telemedicine. The patient expressed understanding and agreed to proceed.    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  Main complaint is snoring, worse in his back  Significant tests/ events reviewed  HST 11/2020showed moderate OSA -this was worse in the supine position 27/h -average 16/hour.  Exam not able-  Meds reviewed   Total encounter time x 23 m

## 2019-02-27 NOTE — Patient Instructions (Signed)
Auto CPAP 5 to 15 cm, nasal pillows, humidity

## 2019-02-27 NOTE — Assessment & Plan Note (Addendum)
Because treatment options including CPAP, dental device and positional therapy alone.  We discussed efficacy, comfort and cost.  He was agreeable to start CPAP. Prescription will be sent for auto CPAP 5 to 15 cm with humidity and nasal pillows.  He will change mask interface if required.  We will follow up with a download and office visit in 6 weeks.  If not comfortable we will proceed with oral appliance I also discussed treatment of positional OSA

## 2019-02-27 NOTE — Addendum Note (Signed)
Addended by: Nena Polio on: 02/27/2019 01:37 PM   Modules accepted: Orders

## 2019-03-01 NOTE — Addendum Note (Signed)
Addended by: Nena Polio on: 03/01/2019 11:36 AM   Modules accepted: Orders

## 2019-03-29 ENCOUNTER — Ambulatory Visit (INDEPENDENT_AMBULATORY_CARE_PROVIDER_SITE_OTHER): Payer: 59 | Admitting: Licensed Clinical Social Worker

## 2019-03-29 DIAGNOSIS — F324 Major depressive disorder, single episode, in partial remission: Secondary | ICD-10-CM | POA: Diagnosis not present

## 2019-04-12 ENCOUNTER — Telehealth: Payer: Self-pay | Admitting: Pulmonary Disease

## 2019-04-12 ENCOUNTER — Other Ambulatory Visit: Payer: Self-pay | Admitting: Adult Health

## 2019-04-12 ENCOUNTER — Encounter: Payer: Self-pay | Admitting: Adult Health

## 2019-04-12 DIAGNOSIS — F4322 Adjustment disorder with anxiety: Secondary | ICD-10-CM

## 2019-04-12 NOTE — Telephone Encounter (Signed)
Ok to send in all three medications for six months

## 2019-04-12 NOTE — Telephone Encounter (Unsigned)
Copied from [place] [id number]. Topic: Quick Communication - Rx Refill/Question >> Apr 12, 2019  3:43 PM Mcneil, Ja-Kwan wrote: Medication: sertraline (ZOLOFT) 50 MG tablet and mirtazapine (REMERON) 7.5 MG tablet  Has the patient contacted their pharmacy? yes   Preferred Pharmacy (with phone number or street name): [facility], [place]  Phone: [phone]  Fax: [phone]  Agent: Please be advised that RX refills may take up to 3 business days. We ask that you follow-up with your pharmacy.

## 2019-04-12 NOTE — Telephone Encounter (Signed)
Tried to schedule pt for a f/u depression visit and pt declined. Pt stated that he never had to have a follow up for medication and only has to see Tommi Rumps once a year.

## 2019-04-12 NOTE — Telephone Encounter (Signed)
Download reviewed on auto 5-15 cm >> cpap effective , avg pr 8.5 cm,events controlled But compliance is poor Can we help him with anything ? He needs to use at least 4-6h / night Arrange for OV with APP in 4 weeks

## 2019-04-12 NOTE — Telephone Encounter (Signed)
Requested medication (s) are due for refill today -yes  Requested medication (s) are on the active medication list -yes  Future visit scheduled -yes  Last refill: 6 months ago  Notes to clinic: Patient is requesting refill of medication due appointment- patient has appointment but maybe too far out to qualify for this medication. Sent for review  Requested Prescriptions  Pending Prescriptions Disp Refills   mirtazapine (REMERON) 7.5 MG tablet 90 tablet 1    Sig: Take 1 tablet (7.5 mg total) by mouth at bedtime.      Psychiatry: Antidepressants - mirtazapine Failed - 04/12/2019  3:55 PM      Failed - AST in normal range and within 360 days    AST  Date Value Ref Range Status  08/25/2018 46 (H) 0 - 37 U/L Final          Failed - ALT in normal range and within 360 days    ALT  Date Value Ref Range Status  08/25/2018 57 (H) 0 - 53 U/L Final          Failed - Total Cholesterol in normal range and within 360 days    Cholesterol  Date Value Ref Range Status  08/25/2018 225 (H) 0 - 200 mg/dL Final    Comment:    ATP III Classification       Desirable:  < 200 mg/dL               Borderline High:  200 - 239 mg/dL          High:  > = 240 mg/dL          Failed - WBC in normal range and within 360 days    WBC  Date Value Ref Range Status  08/25/2018 3.8 (L) 4.0 - 10.5 K/uL Final          Failed - Valid encounter within last 6 months    Recent Outpatient Visits           7 months ago Routine general medical examination at a health care facility   [facility] [place], [place], NP   1 year ago Routine general medical examination at a health care facility   [facility] [place], [place], NP   2 years ago Acute left-sided low back pain without sciatica   [facility], [place], NP   2 years ago Acute left-sided low back pain without sciatica   [facility]rapist[facility] at [facility], [place], NP   2 years ago Left  arm pain   Therapist, music at [facility], [place], NP       Future Appointments             In 4 months Nafziger, Tommi Rumps, NP [facility] [place], [place] - Triglycerides in normal range and within 360 days    Triglycerides  Date Value Ref Range Status  08/25/2018 66.0 0.0 - 149.0 mg/dL Final    Comment:    Normal:  <150 mg/dLBorderline High:  150 - 199 mg/dL            sertraline (ZOLOFT) 50 MG tablet 90 tablet 1    Sig: Take 1 tablet (50 mg total) by mouth daily.      Psychiatry:  Antidepressants - SSRI Failed - 04/12/2019  3:55 PM      Failed - Valid encounter within last 6 months  Recent Outpatient Visits           7 months ago Routine general medical examination at a health care facility   [facility] [place], [place], NP   1 year ago Routine general medical examination at a health care facility   [facility] [place], [place], NP   2 years ago Acute left-sided low back pain without sciatica   Therapist, music at [facility], [place], NP   2 years ago Acute left-sided low back pain without sciatica   Therapist, music at [facility], [place], NP   2 years ago Left arm pain   Therapist, music at [facility], [place], NP       Future Appointments             In 4 months Nafziger, Tommi Rumps, NP [facility] [place], [facility]                Requested Prescriptions  Pending Prescriptions Disp Refills   mirtazapine (REMERON) 7.5 MG tablet 90 tablet 1    Sig: Take 1 tablet (7.5 mg total) by mouth at bedtime.      Psychiatry: Antidepressants - mirtazapine Failed - 04/12/2019  3:55 PM      Failed - AST in normal range and within 360 days    AST  Date Value Ref Range Status  08/25/2018 46 (H) 0 - 37 U/L Final          Failed - ALT in normal range and within 360 days    ALT  Date Value Ref Range Status  08/25/2018 57 (H) 0 - 53 U/L Final          Failed  - Total Cholesterol in normal range and within 360 days    Cholesterol  Date Value Ref Range Status  08/25/2018 225 (H) 0 - 200 mg/dL Final    Comment:    ATP III Classification       Desirable:  < 200 mg/dL               Borderline High:  200 - 239 mg/dL          High:  > = 240 mg/dL          Failed - WBC in normal range and within 360 days    WBC  Date Value Ref Range Status  08/25/2018 3.8 (L) 4.0 - 10.5 K/uL Final          Failed - Valid encounter within last 6 months    Recent Outpatient Visits           7 months ago Routine general medical examination at a health care facility   [facility] [place], [place], NP   1 year ago Routine general medical examination at a health care facility   [facility] [place], [place], NP   2 years ago Acute left-sided low back pain without sciatica   Therapist, music at [facility], [place], NP   2 years ago Acute left-sided low back pain without sciatica   Therapist, music at [facility], [place], NP   2 years ago Left arm pain   Therapist, music at [facility], [place], NP       Future Appointments             In 4 months Nafziger, Tommi Rumps, NP [facility] [place], [place] - Triglycerides in normal range and  within 360 days    Triglycerides  Date Value Ref Range Status  08/25/2018 66.0 0.0 - 149.0 mg/dL Final    Comment:    Normal:  <150 mg/dLBorderline High:  150 - 199 mg/dL            sertraline (ZOLOFT) 50 MG tablet 90 tablet 1    Sig: Take 1 tablet (50 mg total) by mouth daily.      Psychiatry:  Antidepressants - SSRI Failed - 04/12/2019  3:55 PM      Failed - Valid encounter within last 6 months    Recent Outpatient Visits           7 months ago Routine general medical examination at a health care facility   [facility] [place], [place], NP   1 year ago Routine general medical examination at a health care  facility   [facility] [place], [place], NP   2 years ago Acute left-sided low back pain without sciatica   Therapist, music at [facility], [place], NP   2 years ago Acute left-sided low back pain without sciatica   Therapist, music at [facility], [place], NP   2 years ago Left arm pain   Therapist, music at [facility], [place], NP       Future Appointments             In 4 months Nafziger, Tommi Rumps, NP [facility] at [place], [facility]

## 2019-04-13 MED ORDER — SERTRALINE HCL 50 MG PO TABS: 50.0000 mg | ORAL_TABLET | Freq: Every day | ORAL | 1 refills | Status: DC

## 2019-04-13 MED ORDER — ATORVASTATIN CALCIUM 10 MG PO TABS: 10.0000 mg | ORAL_TABLET | Freq: Every day | ORAL | 1 refills | Status: DC

## 2019-04-13 MED ORDER — MIRTAZAPINE 7.5 MG PO TABS: 7.5000 mg | ORAL_TABLET | Freq: Every day | ORAL | 1 refills | Status: DC

## 2019-04-13 MED ORDER — SERTRALINE HCL 50 MG PO TABS: 50.0000 mg | ORAL_TABLET | Freq: Every day | ORAL | 0 refills | Status: DC

## 2019-04-13 MED ORDER — ATORVASTATIN CALCIUM 10 MG PO TABS: 10.0000 mg | ORAL_TABLET | Freq: Every day | ORAL | 0 refills | Status: DC

## 2019-04-21 ENCOUNTER — Other Ambulatory Visit: Payer: Self-pay | Admitting: General Surgery

## 2019-04-21 NOTE — Telephone Encounter (Signed)
Called the patient back and he stated that he is using the full face mask and still adjusting to wearing it. Finds that if he wakes in the middle of the night and sleeping alone, he will take the mask off.   Offered the patient a video visit to allow him more time to adjust to the mask and then be able to discuss any concerns at that time.  Patient was in the car and stated he has multiple virtual conferences, will have to check his calendar and call back.  Reminder made to call him back by week of 05/01/19 if call not returned.

## 2019-05-25 ENCOUNTER — Ambulatory Visit (INDEPENDENT_AMBULATORY_CARE_PROVIDER_SITE_OTHER): Payer: 59 | Admitting: Licensed Clinical Social Worker

## 2019-05-25 DIAGNOSIS — F324 Major depressive disorder, single episode, in partial remission: Secondary | ICD-10-CM | POA: Diagnosis not present

## 2019-06-04 ENCOUNTER — Encounter: Payer: Self-pay | Admitting: Adult Health

## 2019-06-06 MED ORDER — TADALAFIL 20 MG PO TABS: 20.0000 mg | ORAL_TABLET | Freq: Every day | ORAL | 2 refills | Status: DC | PRN

## 2019-06-08 ENCOUNTER — Ambulatory Visit: Payer: 59 | Admitting: Licensed Clinical Social Worker

## 2019-06-13 ENCOUNTER — Ambulatory Visit: Payer: 59 | Admitting: Licensed Clinical Social Worker

## 2019-06-13 ENCOUNTER — Encounter: Payer: Self-pay | Admitting: Adult Health

## 2019-08-02 ENCOUNTER — Ambulatory Visit (INDEPENDENT_AMBULATORY_CARE_PROVIDER_SITE_OTHER): Payer: 59 | Admitting: Psychology

## 2019-08-02 DIAGNOSIS — F4322 Adjustment disorder with anxiety: Secondary | ICD-10-CM | POA: Diagnosis not present

## 2019-08-28 ENCOUNTER — Other Ambulatory Visit: Payer: Self-pay

## 2019-08-28 ENCOUNTER — Encounter: Payer: Self-pay | Admitting: Adult Health

## 2019-08-29 ENCOUNTER — Other Ambulatory Visit (INDEPENDENT_AMBULATORY_CARE_PROVIDER_SITE_OTHER): Payer: 59

## 2019-08-29 ENCOUNTER — Ambulatory Visit (INDEPENDENT_AMBULATORY_CARE_PROVIDER_SITE_OTHER): Payer: 59 | Admitting: Adult Health

## 2019-08-29 ENCOUNTER — Other Ambulatory Visit: Payer: Self-pay | Admitting: Adult Health

## 2019-08-29 ENCOUNTER — Encounter: Payer: Self-pay | Admitting: Adult Health

## 2019-08-29 VITALS — BP 148/90 | Temp 98.1°F | Ht 70.0 in | Wt 191.0 lb

## 2019-08-29 DIAGNOSIS — I10 Essential (primary) hypertension: Secondary | ICD-10-CM | POA: Diagnosis not present

## 2019-08-29 DIAGNOSIS — E782 Mixed hyperlipidemia: Secondary | ICD-10-CM | POA: Diagnosis not present

## 2019-08-29 DIAGNOSIS — G4733 Obstructive sleep apnea (adult) (pediatric): Secondary | ICD-10-CM | POA: Diagnosis not present

## 2019-08-29 DIAGNOSIS — Z125 Encounter for screening for malignant neoplasm of prostate: Secondary | ICD-10-CM

## 2019-08-29 DIAGNOSIS — Z0000 Encounter for general adult medical examination without abnormal findings: Secondary | ICD-10-CM

## 2019-08-29 DIAGNOSIS — F4322 Adjustment disorder with anxiety: Secondary | ICD-10-CM

## 2019-08-29 DIAGNOSIS — G4700 Insomnia, unspecified: Secondary | ICD-10-CM

## 2019-08-29 LAB — CBC WITH DIFFERENTIAL/PLATELET
Basophils Absolute: 0 10*3/uL (ref 0.0–0.1)
Basophils Relative: 0.8 % (ref 0.0–3.0)
Eosinophils Absolute: 0.1 10*3/uL (ref 0.0–0.7)
Eosinophils Relative: 1.9 % (ref 0.0–5.0)
HCT: 47.9 % (ref 39.0–52.0)
Hemoglobin: 16.6 g/dL (ref 13.0–17.0)
Lymphocytes Relative: 18.1 % (ref 12.0–46.0)
Lymphs Abs: 0.8 10*3/uL (ref 0.7–4.0)
MCHC: 34.6 g/dL (ref 30.0–36.0)
MCV: 98.1 fl (ref 78.0–100.0)
Monocytes Absolute: 0.5 10*3/uL (ref 0.1–1.0)
Monocytes Relative: 11 % (ref 3.0–12.0)
Neutro Abs: 3 10*3/uL (ref 1.4–7.7)
Neutrophils Relative %: 68.2 % (ref 43.0–77.0)
Platelets: 118 10*3/uL — ABNORMAL LOW (ref 150.0–400.0)
RBC: 4.88 Mil/uL (ref 4.22–5.81)
RDW: 13.2 % (ref 11.5–15.5)
WBC: 4.4 10*3/uL (ref 4.0–10.5)

## 2019-08-29 LAB — COMPREHENSIVE METABOLIC PANEL WITH GFR
ALT: 56 U/L — ABNORMAL HIGH (ref 0–53)
AST: 58 U/L — ABNORMAL HIGH (ref 0–37)
Albumin: 4.9 g/dL (ref 3.5–5.2)
Alkaline Phosphatase: 75 U/L (ref 39–117)
BUN: 16 mg/dL (ref 6–23)
CO2: 24 meq/L (ref 19–32)
Calcium: 9.7 mg/dL (ref 8.4–10.5)
Chloride: 103 meq/L (ref 96–112)
Creatinine, Ser: 0.99 mg/dL (ref 0.40–1.50)
GFR: 78.21 mL/min (ref >60.00)
Glucose, Bld: 94 mg/dL (ref 70–99)
Potassium: 4.2 meq/L (ref 3.5–5.1)
Sodium: 136 meq/L (ref 135–145)
Total Bilirubin: 0.7 mg/dL (ref 0.2–1.2)
Total Protein: 7.1 g/dL (ref 6.0–8.3)

## 2019-08-29 LAB — LIPID PANEL
Cholesterol: 178 mg/dL (ref 0–200)
HDL: 67.4 mg/dL (ref >39.00)
LDL Cholesterol: 95 mg/dL (ref 0–99)
NonHDL: 110.91
Total CHOL/HDL Ratio: 3
Triglycerides: 79 mg/dL (ref 0.0–149.0)
VLDL: 15.8 mg/dL (ref 0.0–40.0)

## 2019-08-29 LAB — PSA: PSA: 0.58 ng/mL (ref 0.10–4.00)

## 2019-08-29 LAB — HEMOGLOBIN A1C: Hgb A1c MFr Bld: 5.4 % (ref 4.6–6.5)

## 2019-08-29 LAB — TSH: TSH: 2.64 u[IU]/mL (ref 0.35–4.50)

## 2019-08-29 MED ORDER — ATORVASTATIN CALCIUM 10 MG PO TABS: 10.0000 mg | ORAL_TABLET | Freq: Every day | ORAL | 3 refills | Status: DC

## 2019-08-29 MED ORDER — AMLODIPINE BESY-BENAZEPRIL HCL 5-20 MG PO CAPS: 1.0000 | ORAL_CAPSULE | Freq: Every day | ORAL | 3 refills | Status: DC

## 2019-08-29 MED ORDER — MIRTAZAPINE 7.5 MG PO TABS: 7.5000 mg | ORAL_TABLET | Freq: Every day | ORAL | 1 refills | Status: DC

## 2019-08-29 MED ORDER — SERTRALINE HCL 50 MG PO TABS: 50.0000 mg | ORAL_TABLET | Freq: Every day | ORAL | 1 refills | Status: DC

## 2019-08-29 MED ORDER — OMEGA-3-ACID ETHYL ESTERS 1 G PO CAPS: 1.0000 g | ORAL_CAPSULE | Freq: Three times a day (TID) | ORAL | 3 refills | Status: DC

## 2019-08-29 NOTE — Progress Notes (Signed)
Subjective:    Patient ID: Raymond Johnson, male    DOB: 20-Nov-1965, 54 y.o.   MRN: [id number]  HPI   Patient presents for yearly preventative medicine examination. He is a pleasant 54 year old male who  has a past medical history of Anxiety, Chronic headaches, Diverticulitis, ED (erectile dysfunction), Elevated liver enzymes, History of kidney stones, Hypertension, and Seasonal allergies.  Essential hypertension-takes Lotrel 5-20 mg.  He denies dizziness, lightheadedness, chest pain, or shortness of breath. He has not taken his blood pressure  BP Readings from Last 3 Encounters:  08/29/19 (!) 148/90  11/16/18 130/62  08/25/18 134/76   Anxiety -does participate in therapy with a counselor as well as takes Zoloft.  He feels as though his anxiety is well controlled.  Insomnia-takes Remeron 7.5 mg-he feels as though he is sleeping well with this medication  Hyperlipidemia -currently taking Lovaza 1 g 3 times a day and lipitor 10 mg  Lab Results  Component Value Date   CHOL 225 (H) 08/25/2018   HDL 81.50 08/25/2018   LDLCALC 131 (H) 08/25/2018   TRIG 66.0 08/25/2018   CHOLHDL 3 08/25/2018   OSA -was diagnosed with sleep apnea in December 2020.  He was started on CPAP. He reports that on average he is wearing his mask about 4 hours a night. He does not notice a difference. No having fatigue in the morning.   All immunizations and health maintenance protocols were reviewed with the patient and needed orders were placed.  Appropriate screening laboratory values were ordered for the patient including screening of hyperlipidemia, renal function and hepatic function. If indicated by BPH, a PSA was ordered.  Medication reconciliation,  past medical history, social history, problem list and allergies were reviewed in detail with the patient  Goals were established with regard to weight loss, exercise, and  diet in compliance with medications. He tries to stay active and eats a heart  healthy diet - tries not to eat red meat.   Wt Readings from Last 3 Encounters:  08/29/19 191 lb (86.6 kg)  11/16/18 193 lb (87.5 kg)  08/25/18 187 lb (84.8 kg)   He is up to date on routine colon cancer screening   Review of Systems  Constitutional: Negative.   HENT: Negative.   Eyes: Negative.   Respiratory: Negative.   Cardiovascular: Negative.   Gastrointestinal: Negative.   Endocrine: Negative.   Genitourinary: Negative.   Musculoskeletal: Negative.   Skin: Negative.   Allergic/Immunologic: Negative.   Neurological: Negative.   Hematological: Negative.   Psychiatric/Behavioral: Negative.   All other systems reviewed and are negative.  Past Medical History:  Diagnosis Date  . Anxiety   . Chronic headaches    stress headaches  . Diverticulitis   . ED (erectile dysfunction)   . Elevated liver enzymes    history being elevated  . History of kidney stones   . Hypertension   . Seasonal allergies     Social History   Socioeconomic History  . Marital status: Single    Spouse name: Not on file  . Number of children: Not on file  . Years of education: Not on file  . Highest education level: Not on file  Occupational History  . Not on file  Tobacco Use  . Smoking status: Former Smoker    Packs/day: 0.25    Years: 31.00    Pack years: 7.75    Types: Cigarettes    Start date: 1983    Quit  date: 2014    Years since quitting: 7.4  . Smokeless tobacco: Never Used  . Tobacco comment: quit from age 53 for 4 years   Substance and Sexual Activity  . Alcohol use: Yes    Comment: "socially"  . Drug use: No  . Sexual activity: Not on file  Other Topics Concern  . 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 Difficulty of Paying Living Expenses:   Food  Insecurity:   . Worried About Charity fundraiser in the Last Year:   . Arboriculturist in the Last Year:   Transportation Needs:   . Film/video editor (Medical):   Marland Kitchen Lack of Transportation (Non-Medical):   Physical Activity:   . Days of Exercise per Week:   . Minutes of Exercise per Session:   Stress:   . Feeling of Stress :   Social Connections:   . Frequency of Communication with Friends and Family:   . Frequency of Social Gatherings with Friends and Family:   . Attends Religious Services:   . Active Member of Clubs or Organizations:   . Attends Archivist Meetings:   Marland Kitchen Marital Status:   Intimate Partner Violence:   . Fear of Current or Ex-Partner:   . Emotionally Abused:   Marland Kitchen Physically Abused:   . Sexually Abused:     Past Surgical History:  Procedure Laterality Date  . EXTRACORPOREAL SHOCK WAVE LITHOTRIPSY Left 01/28/2017   Procedure: LEFT EXTRACORPOREAL SHOCK WAVE LITHOTRIPSY (ESWL);  Surgeon: Irine Seal, MD;  Location: [facility];  Service: Urology;  Laterality: Left;  . HERNIA REPAIR     at age 42 years    Family History  Problem Relation Age of Onset  . Breast cancer Mother   . Lung cancer Father   . Prostate cancer Brother 40  . Cancer Paternal Grandfather     No Known Allergies  Current Outpatient Medications on File Prior to Visit  Medication Sig Dispense Refill  . amLODipine-benazepril (LOTREL) 5-20 MG capsule Take 1 capsule by mouth daily. 90 capsule 3  . atorvastatin (LIPITOR) 10 MG tablet Take 1 tablet (10 mg total) by mouth daily. 90 tablet 1  . fluticasone (FLONASE) 50 MCG/ACT nasal spray Place 1 spray into both nostrils daily. 48 g 1  . Glucosamine-Chondroitin (GLUCOSAMINE CHONDR COMPLEX PO) Take by mouth.    . mirtazapine (REMERON) 7.5 MG tablet Take 1 tablet (7.5 mg total) by mouth at bedtime. 90 tablet 1  . Multiple Vitamins-Minerals (ZINC PO) Take by mouth.    . omega-3 acid ethyl esters (LOVAZA) 1 g capsule Take 1 capsule (1 g  total) by mouth 3 (three) times daily. 270 capsule 3  . Probiotic Product (PROBIOTIC PO) Take by mouth.    . sertraline (ZOLOFT) 50 MG tablet Take 1 tablet (50 mg total) by mouth daily. 90 tablet 1  . tadalafil (CIALIS) 20 MG tablet Take 1 tablet (20 mg total) by mouth daily as needed. 10 tablet 2   No current facility-administered medications on file prior to visit.    BP (!) 148/90   Temp 98.1 F (36.7 C)   Ht 5\' 10"  (1.778 m)   Wt 191  lb (86.6 kg)   BMI 27.41 kg/m       Objective:   Physical Exam Vitals and nursing note reviewed.  Constitutional:      General: He is not in acute distress.    Appearance: Normal appearance. He is well-developed and normal weight.  HENT:     Head: Normocephalic and atraumatic.     Right Ear: Tympanic membrane, ear canal and external ear normal. There is no impacted cerumen.     Left Ear: Tympanic membrane, ear canal and external ear normal. There is no impacted cerumen.     Nose: Nose normal. No congestion or rhinorrhea.     Mouth/Throat:     Mouth: Mucous membranes are moist.     Pharynx: Oropharynx is clear. No oropharyngeal exudate or posterior oropharyngeal erythema.  Eyes:     General:        Right eye: No discharge.        Left eye: No discharge.     Extraocular Movements: Extraocular movements intact.     Conjunctiva/sclera: Conjunctivae normal.     Pupils: Pupils are equal, round, and reactive to light.  Neck:     Vascular: No carotid bruit.     Trachea: No tracheal deviation.  Cardiovascular:     Rate and Rhythm: Normal rate and regular rhythm.     Pulses: Normal pulses.     Heart sounds: Normal heart sounds. No murmur. No friction rub. No gallop.   Pulmonary:     Effort: Pulmonary effort is normal. No respiratory distress.     Breath sounds: Normal breath sounds. No stridor. No wheezing, rhonchi or rales.  Chest:     Chest wall: No tenderness.  Abdominal:     General: Bowel sounds are normal. There is no distension.      Palpations: Abdomen is soft. There is no mass.     Tenderness: There is no abdominal tenderness. There is no right CVA tenderness, left CVA tenderness, guarding or rebound.     Hernia: No hernia is present.  Musculoskeletal:        General: No swelling, tenderness, deformity or signs of injury. Normal range of motion.     Right lower leg: No edema.     Left lower leg: No edema.  Lymphadenopathy:     Cervical: No cervical adenopathy.  Skin:    General: Skin is warm and dry.     Capillary Refill: Capillary refill takes less than 2 seconds.     Coloration: Skin is not jaundiced or pale.     Findings: No bruising, erythema, lesion or rash.  Neurological:     General: No focal deficit present.     Mental Status: He is alert and oriented to person, place, and time.     Cranial Nerves: No cranial nerve deficit.     Sensory: No sensory deficit.     Motor: No weakness.     Coordination: Coordination normal.     Gait: Gait normal.     Deep Tendon Reflexes: Reflexes normal.  Psychiatric:        Mood and Affect: Mood normal.        Behavior: Behavior normal.        Thought Content: Thought content normal.        Judgment: Judgment normal.       Assessment & Plan:  1. Routine general medical examination at a health care facility - Follow up in one year or sooner if needed - CBC  with Differential/Platelet; Future - Comprehensive metabolic panel; Future - Hemoglobin A1c; Future - Lipid panel; Future - TSH; Future  2. Essential hypertension - No change in medications  - CBC with Differential/Platelet; Future - Comprehensive metabolic panel; Future - Hemoglobin A1c; Future - Lipid panel; Future - TSH; Future - amLODipine-benazepril (LOTREL) 5-20 MG capsule; Take 1 capsule by mouth daily.  Dispense: 90 capsule; Refill: 3  3. Mixed hyperlipidemia - Consider increase instatin  - CBC with Differential/Platelet; Future - Comprehensive metabolic panel; Future - Hemoglobin A1c; Future -  Lipid panel; Future - TSH; Future - omega-3 acid ethyl esters (LOVAZA) 1 g capsule; Take 1 capsule (1 g total) by mouth 3 (three) times daily.  Dispense: 270 capsule; Refill: 3  4. Prostate cancer screening  - PSA; Future  5. OSA (obstructive sleep apnea) - Follow up with pulmonary as directed  - CBC with Differential/Platelet; Future - Comprehensive metabolic panel; Future - Hemoglobin A1c; Future - Lipid panel; Future - TSH; Future  6. Adjustment disorder with anxious mood - Well controlled. No change in dose  - mirtazapine (REMERON) 7.5 MG tablet; Take 1 tablet (7.5 mg total) by mouth at bedtime.  Dispense: 90 tablet; Refill: 1 - sertraline (ZOLOFT) 50 MG tablet; Take 1 tablet (50 mg total) by mouth daily.  Dispense: 90 tablet; Refill: 1  7. Insomnia, unspecified type - Well controlled. No change in dose  - mirtazapine (REMERON) 7.5 MG tablet; Take 1 tablet (7.5 mg total) by mouth at bedtime.  Dispense: 90 tablet; Refill: 1  [name], NP

## 2019-08-29 NOTE — Patient Instructions (Addendum)
It was great seeing you today   Please go over to the [facility] building for your lab work   [place]   We will follow up with you once your labs are back   Continue to work on lifestyle modificatins

## 2019-08-30 ENCOUNTER — Ambulatory Visit (INDEPENDENT_AMBULATORY_CARE_PROVIDER_SITE_OTHER): Payer: 59 | Admitting: Psychology

## 2019-08-30 DIAGNOSIS — F4322 Adjustment disorder with anxiety: Secondary | ICD-10-CM

## 2019-09-11 ENCOUNTER — Encounter: Payer: Self-pay | Admitting: Family Medicine

## 2019-09-11 ENCOUNTER — Ambulatory Visit (INDEPENDENT_AMBULATORY_CARE_PROVIDER_SITE_OTHER): Payer: 59 | Admitting: Family Medicine

## 2019-09-11 ENCOUNTER — Other Ambulatory Visit: Payer: Self-pay

## 2019-09-11 VITALS — BP 140/70 | HR 107 | Temp 98.3°F | Wt 188.6 lb

## 2019-09-11 DIAGNOSIS — H00012 Hordeolum externum right lower eyelid: Secondary | ICD-10-CM

## 2019-09-11 NOTE — Progress Notes (Signed)
   Subjective:    Patient ID: Raymond Johnson, male    DOB: 1965/11/09, 54 y.o.   MRN: [id number]  HPI Here for 3 days of a painful swelling in the right lower eyelid. It seems to be better today. He has never had this before.    Review of Systems  Constitutional: Negative.   HENT: Negative.   Eyes: Negative for discharge, redness, itching and visual disturbance.  Respiratory: Negative.        Objective:   Physical Exam Constitutional:      Appearance: Normal appearance.  HENT:     Right Ear: Tympanic membrane, ear canal and external ear normal.     Left Ear: Tympanic membrane, ear canal and external ear normal.     Nose: Nose normal.     Mouth/Throat:     Pharynx: Oropharynx is clear.  Eyes:     Extraocular Movements: Extraocular movements intact.     Conjunctiva/sclera: Conjunctivae normal.     Pupils: Pupils are equal, round, and reactive to light.     Comments: The right lower eye lid has as small tender lump on the nasal edge   Pulmonary:     Effort: Pulmonary effort is normal.     Breath sounds: Normal breath sounds.  Lymphadenopathy:     Cervical: No cervical adenopathy.  Neurological:     Mental Status: He is alert.           Assessment & Plan:  Stye, apply warm compresses. Recheck prn.  Alysia Penna, MD

## 2019-10-03 ENCOUNTER — Ambulatory Visit: Payer: 59 | Admitting: Psychology

## 2019-10-09 ENCOUNTER — Ambulatory Visit (INDEPENDENT_AMBULATORY_CARE_PROVIDER_SITE_OTHER): Payer: 59 | Admitting: Psychology

## 2019-10-09 DIAGNOSIS — F4322 Adjustment disorder with anxiety: Secondary | ICD-10-CM

## 2019-10-24 ENCOUNTER — Ambulatory Visit (INDEPENDENT_AMBULATORY_CARE_PROVIDER_SITE_OTHER): Payer: 59 | Admitting: Psychology

## 2019-10-24 DIAGNOSIS — F4322 Adjustment disorder with anxiety: Secondary | ICD-10-CM | POA: Diagnosis not present

## 2019-11-13 ENCOUNTER — Encounter: Payer: Self-pay | Admitting: Adult Health

## 2019-11-13 DIAGNOSIS — E782 Mixed hyperlipidemia: Secondary | ICD-10-CM

## 2019-11-14 MED ORDER — OMEGA-3-ACID ETHYL ESTERS 1 G PO CAPS: 1.0000 g | ORAL_CAPSULE | Freq: Three times a day (TID) | ORAL | 3 refills | Status: DC

## 2019-12-19 ENCOUNTER — Ambulatory Visit (INDEPENDENT_AMBULATORY_CARE_PROVIDER_SITE_OTHER): Payer: 59 | Admitting: Psychology

## 2019-12-19 DIAGNOSIS — F4322 Adjustment disorder with anxiety: Secondary | ICD-10-CM | POA: Diagnosis not present

## 2020-01-02 ENCOUNTER — Other Ambulatory Visit: Payer: Self-pay

## 2020-01-02 ENCOUNTER — Ambulatory Visit (INDEPENDENT_AMBULATORY_CARE_PROVIDER_SITE_OTHER): Payer: 59 | Admitting: *Deleted

## 2020-01-02 DIAGNOSIS — Z23 Encounter for immunization: Secondary | ICD-10-CM

## 2020-01-03 ENCOUNTER — Ambulatory Visit: Payer: 59 | Admitting: Psychology

## 2020-01-11 ENCOUNTER — Ambulatory Visit (INDEPENDENT_AMBULATORY_CARE_PROVIDER_SITE_OTHER): Payer: 59 | Admitting: Psychology

## 2020-01-11 DIAGNOSIS — F4320 Adjustment disorder, unspecified: Secondary | ICD-10-CM | POA: Diagnosis not present

## 2020-01-17 ENCOUNTER — Ambulatory Visit (INDEPENDENT_AMBULATORY_CARE_PROVIDER_SITE_OTHER): Payer: 59 | Admitting: Psychology

## 2020-01-17 DIAGNOSIS — F4320 Adjustment disorder, unspecified: Secondary | ICD-10-CM | POA: Diagnosis not present

## 2020-01-19 ENCOUNTER — Ambulatory Visit (INDEPENDENT_AMBULATORY_CARE_PROVIDER_SITE_OTHER): Payer: 59 | Admitting: Psychology

## 2020-01-19 DIAGNOSIS — F4322 Adjustment disorder with anxiety: Secondary | ICD-10-CM

## 2020-01-24 ENCOUNTER — Ambulatory Visit (INDEPENDENT_AMBULATORY_CARE_PROVIDER_SITE_OTHER): Payer: 59 | Admitting: Psychology

## 2020-01-24 DIAGNOSIS — F4320 Adjustment disorder, unspecified: Secondary | ICD-10-CM | POA: Diagnosis not present

## 2020-01-30 ENCOUNTER — Ambulatory Visit (INDEPENDENT_AMBULATORY_CARE_PROVIDER_SITE_OTHER): Payer: 59 | Admitting: Psychology

## 2020-01-30 DIAGNOSIS — F4322 Adjustment disorder with anxiety: Secondary | ICD-10-CM | POA: Diagnosis not present

## 2020-02-13 ENCOUNTER — Ambulatory Visit: Payer: 59 | Admitting: Psychology

## 2020-02-21 ENCOUNTER — Ambulatory Visit: Payer: 59 | Admitting: Psychology

## 2020-03-04 ENCOUNTER — Ambulatory Visit (INDEPENDENT_AMBULATORY_CARE_PROVIDER_SITE_OTHER): Payer: 59 | Admitting: Psychology

## 2020-03-04 DIAGNOSIS — F4320 Adjustment disorder, unspecified: Secondary | ICD-10-CM

## 2020-03-11 ENCOUNTER — Ambulatory Visit (INDEPENDENT_AMBULATORY_CARE_PROVIDER_SITE_OTHER): Payer: 59 | Admitting: Psychology

## 2020-03-11 DIAGNOSIS — F4320 Adjustment disorder, unspecified: Secondary | ICD-10-CM

## 2020-03-20 ENCOUNTER — Ambulatory Visit (INDEPENDENT_AMBULATORY_CARE_PROVIDER_SITE_OTHER): Payer: 59 | Admitting: Psychology

## 2020-03-20 DIAGNOSIS — F4320 Adjustment disorder, unspecified: Secondary | ICD-10-CM | POA: Diagnosis not present

## 2020-03-21 ENCOUNTER — Encounter: Payer: Self-pay | Admitting: Adult Health

## 2020-03-21 DIAGNOSIS — F4322 Adjustment disorder with anxiety: Secondary | ICD-10-CM

## 2020-03-21 MED ORDER — SERTRALINE HCL 50 MG PO TABS: 50.0000 mg | ORAL_TABLET | Freq: Every day | ORAL | 1 refills | Status: DC

## 2020-04-03 ENCOUNTER — Ambulatory Visit (INDEPENDENT_AMBULATORY_CARE_PROVIDER_SITE_OTHER): Payer: 59 | Admitting: Psychology

## 2020-04-03 DIAGNOSIS — F4320 Adjustment disorder, unspecified: Secondary | ICD-10-CM | POA: Diagnosis not present

## 2020-04-04 ENCOUNTER — Telehealth: Payer: Self-pay | Admitting: Adult Health

## 2020-04-04 NOTE — Telephone Encounter (Signed)
Pt call and stated he need a refill on  fluticasone (FLONASE) 50 MCG/ACT nasal spray sent to  [facility], [place] Phone:  [phone]  Fax:  [phone]      v

## 2020-04-05 MED ORDER — FLUTICASONE PROPIONATE 50 MCG/ACT NA SUSP: 1.0000 | Freq: Every day | NASAL | 1 refills | Status: DC

## 2020-04-05 NOTE — Telephone Encounter (Signed)
SENT TO THE PHARMACY BY E-SCRIBE. 

## 2020-04-11 ENCOUNTER — Encounter: Payer: Self-pay | Admitting: Adult Health

## 2020-04-11 DIAGNOSIS — F4322 Adjustment disorder with anxiety: Secondary | ICD-10-CM

## 2020-04-11 DIAGNOSIS — G4700 Insomnia, unspecified: Secondary | ICD-10-CM

## 2020-04-11 MED ORDER — MIRTAZAPINE 7.5 MG PO TABS: 7.5000 mg | ORAL_TABLET | Freq: Every day | ORAL | 1 refills | Status: DC

## 2020-04-17 ENCOUNTER — Ambulatory Visit (INDEPENDENT_AMBULATORY_CARE_PROVIDER_SITE_OTHER): Payer: 59 | Admitting: Psychology

## 2020-04-17 DIAGNOSIS — F4320 Adjustment disorder, unspecified: Secondary | ICD-10-CM | POA: Diagnosis not present

## 2020-05-02 ENCOUNTER — Ambulatory Visit: Payer: 59 | Admitting: Psychology

## 2020-05-17 ENCOUNTER — Ambulatory Visit (INDEPENDENT_AMBULATORY_CARE_PROVIDER_SITE_OTHER): Payer: 59 | Admitting: Psychology

## 2020-05-17 DIAGNOSIS — F4320 Adjustment disorder, unspecified: Secondary | ICD-10-CM

## 2020-05-20 ENCOUNTER — Ambulatory Visit (INDEPENDENT_AMBULATORY_CARE_PROVIDER_SITE_OTHER): Payer: 59 | Admitting: Psychology

## 2020-05-20 ENCOUNTER — Encounter: Payer: Self-pay | Admitting: Adult Health

## 2020-05-20 DIAGNOSIS — F4320 Adjustment disorder, unspecified: Secondary | ICD-10-CM | POA: Diagnosis not present

## 2020-05-21 ENCOUNTER — Other Ambulatory Visit: Payer: Self-pay

## 2020-05-21 ENCOUNTER — Telehealth (INDEPENDENT_AMBULATORY_CARE_PROVIDER_SITE_OTHER): Payer: 59 | Admitting: Adult Health

## 2020-05-21 DIAGNOSIS — F4322 Adjustment disorder with anxiety: Secondary | ICD-10-CM | POA: Diagnosis not present

## 2020-05-21 DIAGNOSIS — R1310 Dysphagia, unspecified: Secondary | ICD-10-CM

## 2020-05-21 NOTE — Progress Notes (Signed)
Virtual Visit via Telephone Note  I connected with Raymond Johnson on 05/21/20 at  5:00 PM EST by telephone and verified that I am speaking with the correct person using two identifiers.   I discussed the limitations, risks, security and privacy concerns of performing an evaluation and management service by telephone and the availability of in person appointments. I also discussed with the patient that there may be a patient responsible charge related to this service. The patient expressed understanding and agreed to proceed.  Location patient: home Location provider: work or home office Participants present for the call: patient, provider Patient did not have a visit in the prior 7 days to address this/these issue(s).   History of Present Illness: 55 year old male who is being evaluated today for 2 separate issues  1. Dysphagia -has been an intermittent issue for the last few years. He reports that once or twice every few months he will feel as though he is choking when he is eating. During these episodes he has trouble breathing and will sometimes cause vomiting. Happens more frequently when he eats fast and at night. Denies significant GERD-like symptoms. He would like to be referred to gastroenterology  2. Anxiety and mood disorder -currently prescribed Remeron 7.5 mg for insomnia, reports that this continues to work well for his sleep issues, but over the last year or so he has had more increased stress due to work and transitioning into his role as a new husband( married in November 2021). He is currently going to therapy and finds this helpful. Also on Zoloft 50 mg daily which she has been on for multiple years. He would like to be referred to psychiatry for further evaluation   Observations/Objective: Patient sounds cheerful and well on the phone. I do not appreciate any SOB. Speech and thought processing are grossly intact. Patient reported vitals:  Assessment and Plan: 1.  Dysphagia, unspecified type ? Dysphagia vs esophageal spasm vs eating too fast.  Advised smaller bites and slower eating in the meantime.  We'll send to gastroenterology per patient request - Ambulatory referral to Gastroenterology  2. Adjustment disorder with anxious mood - will have him increase Zoloft to 100 mg and follow up in 30 days if not improving. Send to psychiatry per patient request  - Ambulatory referral to Psychiatry   Follow Up Instructions:   I did not refer this patient for an OV in the next 24 hours for this/these issue(s).  I discussed the assessment and treatment plan with the patient. The patient was provided an opportunity to ask questions and all were answered. The patient agreed with the plan and demonstrated an understanding of the instructions.   The patient was advised to call back or seek an in-person evaluation if the symptoms worsen or if the condition fails to improve as anticipated.  I provided 22 minutes of non-face-to-face time during this encounter.   [name], NP

## 2020-05-22 ENCOUNTER — Encounter: Payer: Self-pay | Admitting: Adult Health

## 2020-05-23 ENCOUNTER — Encounter: Payer: Self-pay | Admitting: Gastroenterology

## 2020-05-27 ENCOUNTER — Ambulatory Visit (INDEPENDENT_AMBULATORY_CARE_PROVIDER_SITE_OTHER): Payer: 59 | Admitting: Psychology

## 2020-05-27 DIAGNOSIS — F4320 Adjustment disorder, unspecified: Secondary | ICD-10-CM | POA: Diagnosis not present

## 2020-06-13 ENCOUNTER — Ambulatory Visit (INDEPENDENT_AMBULATORY_CARE_PROVIDER_SITE_OTHER): Payer: 59 | Admitting: Psychology

## 2020-06-13 DIAGNOSIS — F4320 Adjustment disorder, unspecified: Secondary | ICD-10-CM | POA: Diagnosis not present

## 2020-06-28 ENCOUNTER — Ambulatory Visit (INDEPENDENT_AMBULATORY_CARE_PROVIDER_SITE_OTHER): Payer: 59 | Admitting: Psychology

## 2020-06-28 DIAGNOSIS — F4320 Adjustment disorder, unspecified: Secondary | ICD-10-CM

## 2020-07-03 ENCOUNTER — Telehealth (HOSPITAL_COMMUNITY): Payer: 59 | Admitting: Psychiatry

## 2020-07-09 ENCOUNTER — Telehealth (INDEPENDENT_AMBULATORY_CARE_PROVIDER_SITE_OTHER): Payer: 59 | Admitting: Psychiatry

## 2020-07-09 ENCOUNTER — Encounter (HOSPITAL_COMMUNITY): Payer: Self-pay | Admitting: Psychiatry

## 2020-07-09 DIAGNOSIS — Z639 Problem related to primary support group, unspecified: Secondary | ICD-10-CM | POA: Diagnosis not present

## 2020-07-09 DIAGNOSIS — F4322 Adjustment disorder with anxiety: Secondary | ICD-10-CM

## 2020-07-09 DIAGNOSIS — F5102 Adjustment insomnia: Secondary | ICD-10-CM

## 2020-07-09 NOTE — Progress Notes (Signed)
Psychiatric Initial Adult Assessment   Patient Identification: Raymond Johnson MRN:  [id number] Date of Evaluation:  07/09/2020 Referral Source: primary care Chief Complaint:  establish care, anxiety, relationship concerns Visit Diagnosis:    ICD-10-CM   1. Adjustment disorder with anxious mood  F43.22   2. Adjustment insomnia  F51.02   3. Relationship problems  Z63.9   Virtual Visit via Video Note  I connected with Raymond Johnson on 07/09/20 at  9:00 AM EDT by a video enabled telemedicine application and verified that I am speaking with the correct person using two identifiers.  Location: Patient: office  Provider: office   I discussed the limitations of evaluation and management by telemedicine and the availability of in person appointments. The patient expressed understanding and agreed to proceed.     I discussed the assessment and treatment plan with the patient. The patient was provided an opportunity to ask questions and all were answered. The patient agreed with the plan and demonstrated an understanding of the instructions.   The patient was advised to call back or seek an in-person evaluation if the symptoms worsen or if the condition fails to improve as anticipated.  I provided 55  minutes of non-face-to-face time during this encounter with documentation   [name], MD   History of Present Illness: Patient is a 55 years old currently married Caucasian male he works in Arts administrator dealing with veterinarians  He is having difficulty in his marriage .  This is a second marriage for the last 1 year but it is getting stressful feels belittled by her she feels that he has anger issues or have narcissistic personality.  Patient is seeing a therapist and also doing couples therapy.  Therapist is Lenard Lance there is already a 6 sessions.  Patient stressed about the marriage and that is adding up to the stress at work he was having sleep disturbance feeling  overwhelmed and feels his wife argues and puts him down and that affects his mood.  He is having difficulty adjusting to this current relationship and has episodes of depression but in general he is feeling overwhelmed stressed anxious related to current relationship.  They live in the same home but she is about to leave and packed up.  He feels the stress related to above  And it effects his job and adds this stress is always in the back of his mind. He is in therapy individual and couples This stress had effected his sleep, doing better since on remeron He is told he gets angry, denies getting impulsive but arguments or conflicts effect his mood,  This is a second marriage first marriage lasted 22 years and ended when there is a lot of stress related to that job stress finances and relocation because of jobs back-and-forth.  She was then seeing somebody else. He has kept a good relationship with 2 of the 3 kids  Does not endorse psychotic symptoms manic symptoms.  He has had episodes of depression when there was job stress during the recession and he had to relocate he was in financial debt because of first divorce and also in the past due to his mom's death  Never been hospitalized Patient has Jobs in the past and despite being depressed as he had to continue to learn and keep himself productive  Patient is started on sertraline and Remeron Remeron has helped him sleep sertraline has helped some of the depression still endorses anxiety and excessive worries related  to his current relationship and uncertainity.    Aggravating factor: relationship stress, job stress Modifying factor: kids,   Duration few months  Past psych admission denies   Past Psychiatric History: denies  Previous Psychotropic Medications: No   Substance Abuse History in the last 12 months:  No.  Consequences of Substance Abuse: NA  Past Medical History:  Past Medical History:  Diagnosis Date  . Anxiety    . Chronic headaches    stress headaches  . Diverticulitis   . ED (erectile dysfunction)   . Elevated liver enzymes    history being elevated  . History of kidney stones   . Hypertension   . Seasonal allergies     Past Surgical History:  Procedure Laterality Date  . EXTRACORPOREAL SHOCK WAVE LITHOTRIPSY Left 01/28/2017   Procedure: LEFT EXTRACORPOREAL SHOCK WAVE LITHOTRIPSY (ESWL);  Surgeon: Irine Seal, MD;  Location: [facility];  Service: Urology;  Laterality: Left;  . HERNIA REPAIR     at age 73 years    Family Psychiatric History: denies : possible brother depression  Family History:  Family History  Problem Relation Age of Onset  . Breast cancer Mother   . Lung cancer Father   . Prostate cancer Brother 28  . Cancer Paternal Grandfather     Social History:   Social History   Socioeconomic History  . Marital status: Single    Spouse name: Not on file  . Number of children: Not on file  . Years of education: Not on file  . Highest education level: Not on file  Occupational History  . Not on file  Tobacco Use  . Smoking status: Former Smoker    Packs/day: 0.25    Years: 31.00    Pack years: 7.75    Types: Cigarettes    Start date: 32    Quit date: 2014    Years since quitting: 8.3  . Smokeless tobacco: Never Used  . Tobacco comment: quit from age 70 for 4 years   Vaping Use  . Vaping Use: Never used  Substance and Sexual Activity  . Alcohol use: Yes    Comment: "socially"  . Drug use: No  . Sexual activity: Not on file  Other Topics Concern  . 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  Did good in high school. No bulllying or denies trauma  Allergies:  No Known Allergies  Metabolic Disorder Labs: Lab Results  Component Value Date   HGBA1C 5.4 08/29/2019   No results found for: PROLACTIN Lab Results  Component Value Date   CHOL 178 08/29/2019   TRIG 79.0 08/29/2019   HDL 67.40 08/29/2019   CHOLHDL 3 08/29/2019   VLDL 15.8 08/29/2019   LDLCALC 95 08/29/2019   LDLCALC 131 (H) 08/25/2018   Lab Results  Component Value Date   TSH 2.64 08/29/2019    Therapeutic Level Labs: No results found  for: LITHIUM No results found for: CBMZ No results found for: VALPROATE  Current Medications: Current Outpatient Medications  Medication Sig Dispense Refill  . amLODipine-benazepril (LOTREL) 5-20 MG capsule Take 1 capsule by mouth daily. 90 capsule 3  . atorvastatin (LIPITOR) 10 MG tablet Take 1 tablet (10 mg total) by mouth daily. (Patient taking differently: No sig reported) 90 tablet 3  . fluticasone (FLONASE) 50 MCG/ACT nasal spray Place 1 spray into both nostrils daily. 48 g 1  . Glucosamine-Chondroitin (GLUCOSAMINE CHONDR COMPLEX PO) Take by mouth.    . mirtazapine (REMERON) 7.5 MG tablet Take 1 tablet (7.5 mg total) by mouth at bedtime. 90 tablet 1  . Multiple Vitamins-Minerals (ZINC PO) Take by mouth.    . omega-3 acid ethyl esters (LOVAZA) 1 g capsule Take 1 capsule (1 g total) by mouth 3 (three) times daily. 270 capsule 3  . Probiotic Product (PROBIOTIC PO) Take by mouth.    . sertraline (ZOLOFT) 50 MG tablet Take 1 tablet (50 mg total) by mouth daily. 90 tablet 1  . tadalafil (CIALIS) 20 MG tablet Take 1 tablet (20 mg total) by mouth daily as needed. 10 tablet 2   No current facility-administered medications for this visit.     Psychiatric Specialty Exam: Review of Systems  Psychiatric/Behavioral: Negative for agitation and self-injury.     There were no vitals taken for this visit.There is no height or weight on file to calculate BMI.  General Appearance: Fairly Groomed  Eye Contact:  Good  Speech:  Normal Rate  Volume:  Normal  Mood:  somewhat subdued  Affect:  Congruent  Thought Process:  Goal Directed  Orientation:  Full (Time, Place, and Person)  Thought Content:  Rumination  Suicidal Thoughts:  No  Homicidal Thoughts:  No  Memory:  Immediate;   Fair Recent;   Fair  Judgement:  Fair  Insight:  Fair  Psychomotor Activity:  Normal  Concentration:  Concentration: Fair and Attention Span: Fair  Recall:  Good  Fund of Knowledge:Good  Language: Good  Akathisia:  No  Handed:   AIMS (if indicated):  not done  Assets:  Financial Resources/Insurance Housing Physical Health  ADL's:  Intact  Cognition: WNL  Sleep:  Fair   Screenings: PHQ2-9   Flowsheet Row Video Visit from 07/09/2020 in [place]  PHQ-2 Total Score 1    Flowsheet Row Video Visit from 07/09/2020 in [place] No Risk      Assessment and Plan: as follows  Adjustment disorder with anxious mood continue therapy increase sertraline to 75 mg discussed to focus on self-esteem and physical activities and distractions from the anxiety  Insomnia; continue Remeron that will help as an antidepressant as well  Relationship concerns; continue individual therapy and triple therapy   Discussed to focus on positive self image and not to dwell on other people's judgment continue work on therapy  Follow-up in 3 to 4 weeks to review medications or earlier if needed [name], MD 4/19/20229:42 AM

## 2020-07-10 ENCOUNTER — Ambulatory Visit (INDEPENDENT_AMBULATORY_CARE_PROVIDER_SITE_OTHER): Payer: 59 | Admitting: Psychology

## 2020-07-10 DIAGNOSIS — F4320 Adjustment disorder, unspecified: Secondary | ICD-10-CM

## 2020-07-15 ENCOUNTER — Ambulatory Visit (INDEPENDENT_AMBULATORY_CARE_PROVIDER_SITE_OTHER): Payer: 59 | Admitting: Gastroenterology

## 2020-07-15 ENCOUNTER — Encounter: Payer: Self-pay | Admitting: Gastroenterology

## 2020-07-15 VITALS — BP 124/80 | HR 88 | Ht 68.5 in | Wt 189.0 lb

## 2020-07-15 DIAGNOSIS — R1319 Other dysphagia: Secondary | ICD-10-CM

## 2020-07-15 NOTE — Progress Notes (Signed)
[facility] Consult Note:  History: Raymond Johnson 07/15/2020  Referring provider: [name], NP  Reason for consult/chief complaint: Dysphagia (Food getting stuck, felt like having a heart attack)   Subjective  HPI:  This is a very pleasant 55 year old man referred by primary care for dysphagia.  It has been occurring for about the last 2 years, but getting more frequent with a couple of episodes that have been severe.  He will have occasional mild feeling of tightness if he drinks a cold liquid, but the severe episodes of been one food felt completely lodged for sometimes up to 10 to 15 minutes before it finally passed.  The chest pain is intense when that occurs but he is able to cough and talk. He denies heartburn or regurgitation.  He often belches, but thinks that he eats too quickly.  Raymond Johnson denies chronic constipation or diarrhea or rectal bleeding.  He had a colonoscopy in the [facility] April 2016 with a diminutive rectal hyperplastic polyp removed.  ROS:  Review of Systems  Constitutional: Negative for appetite change and unexpected weight change.  HENT: Negative for mouth sores and voice change.   Eyes: Negative for pain and redness.  Respiratory: Negative for cough and shortness of breath.   Cardiovascular: Negative for chest pain and palpitations.  Genitourinary: Negative for dysuria and hematuria.  Musculoskeletal: Negative for arthralgias and myalgias.  Skin: Negative for pallor and rash.  Neurological: Negative for weakness and headaches.  Hematological: Negative for adenopathy.     Past Medical History: Past Medical History:  Diagnosis Date  . Anxiety   . Chronic headaches    stress headaches  . Diverticulitis   . ED (erectile dysfunction)   . Elevated liver enzymes    history being elevated  . Fatty liver   . History of colonic polyps   . History of kidney stones   . HLD (hyperlipidemia)   . Hypertension   .  Seasonal allergies      Past Surgical History: Past Surgical History:  Procedure Laterality Date  . COLONOSCOPY  2016   [facility]   . EXTRACORPOREAL SHOCK WAVE LITHOTRIPSY Left 01/28/2017   Procedure: LEFT EXTRACORPOREAL SHOCK WAVE LITHOTRIPSY (ESWL);  Surgeon: Irine Seal, MD;  Location: [facility];  Service: Urology;  Laterality: Left;  . HERNIA REPAIR     at age 67 years  . skin cancer        Family History: Family History  Problem Relation Age of Onset  . Breast cancer Mother   . Lung cancer Father   . Prostate cancer Brother 79  . Cancer Paternal Grandfather        ? colon     Social History: Social History   Socioeconomic History  . Marital status: Single    Spouse name: Not on file  . Number of children: 3  . Years of education: Not on file  . Highest education level: Not on file  Occupational History  . Occupation: Sales  Tobacco Use  . Smoking status: Former Smoker    Packs/day: 0.25    Years: 31.00    Pack years: 7.75    Types: Cigarettes    Start date: 63    Quit date: 2014    Years since quitting: 8.3  . Smokeless tobacco: Never Used  . Tobacco comment: quit from age 80 for 4 years   Vaping Use  . Vaping Use: Never used  Substance and Sexual Activity  . Alcohol  use: Yes    Comment: "socially"  . Drug use: No  . Sexual activity: Not on file  Other Topics Concern  . 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  amLODipine-benazepril (LOTREL) 5-20 MG capsule Take 1 capsule by mouth daily. 90 capsule 3  . atorvastatin (LIPITOR) 10 MG tablet Take 1 tablet (10 mg total) by mouth daily. (Patient taking differently: No sig reported) 90 tablet 3  . fluticasone (FLONASE) 50 MCG/ACT nasal spray Place 1 spray into both nostrils daily. 48 g 1  . Glucosamine-Chondroitin (GLUCOSAMINE CHONDR COMPLEX PO) Take by mouth.    . mirtazapine (REMERON) 7.5 MG tablet Take 1 tablet (7.5 mg total) by mouth at bedtime. 90 tablet 1  . Multiple Vitamins-Minerals (ZINC PO) Take by mouth.    . omega-3 acid ethyl esters (LOVAZA) 1 g capsule Take 1 capsule (1 g total) by mouth 3 (three) times daily. 270 capsule 3  . Probiotic Product (PROBIOTIC PO) Take by mouth.    . sertraline (ZOLOFT) 50 MG tablet Take 1 tablet (50 mg total) by mouth daily. 90 tablet 1  . tadalafil (CIALIS) 20 MG tablet Take 1 tablet (20 mg total) by mouth daily as needed. 10 tablet 2   No current facility-administered medications for this visit.      ___________________________________________________________________ Objective   Exam:  BP 124/80 (BP Location: Left Arm, Patient Position: Sitting, Cuff Size: Normal)   Pulse 88   Ht 5' 8.5" (1.74 m) Comment: height measured without shoes  Wt 189 lb (85.7 kg)   BMI 28.32 kg/m  Wt Readings from Last 3 Encounters:  07/15/20 189 lb (85.7 kg)  09/11/19 188 lb 9.6 oz (85.5 kg)  08/29/19 191 lb (86.6 kg)     General: Well-appearing, normal vocal quality  Eyes: sclera anicteric, no redness  ENT: oral mucosa moist without lesions, no cervical or supraclavicular lymphadenopathy  CV: RRR without murmur, S1/S2, no JVD, no peripheral edema  Resp: clear to auscultation bilaterally, normal RR and effort noted  GI: soft, no tenderness, with active bowel sounds. No guarding or palpable organomegaly noted.  Skin; warm and dry, no rash or jaundice noted  Neuro: awake, alert and oriented x 3. Normal gross  motor function and fluent speech  No pertinent data for review  Assessment: Encounter Diagnosis  Name Primary?  . Esophageal dysphagia Yes    Most likely esophageal stricture, less likely motility disorder such as DES or achalasia. He does not have chronic reflux symptoms  Plan:  Upper endoscopy with possible dilation.  He was agreeable after discussion of procedure and risks.  The benefits and risks of the planned procedure were described in detail with the patient or (when appropriate) their health care proxy.  Risks were outlined as including, but not limited to, bleeding, infection, perforation, adverse medication reaction leading to cardiac or pulmonary decompensation, pancreatitis (if ERCP).  The  limitation of incomplete mucosal visualization was also discussed.  No guarantees or warranties were given.   Thank you for the courtesy of this consult.  Please call me with any questions or concerns.  Nelida Meuse III  CC: Referring provider noted above

## 2020-07-15 NOTE — Patient Instructions (Signed)
If you are age 57 or older, your body mass index should be between 23-30. Your Body mass index is 28.32 kg/m. If this is out of the aforementioned range listed, please consider follow up with your Primary Care Provider.  If you are age 55 or younger, your body mass index should be between 19-25. Your Body mass index is 28.32 kg/m. If this is out of the aformentioned range listed, please consider follow up with your Primary Care Provider.   You have been scheduled for an endoscopy. Please follow written instructions given to you at your visit today. If you use inhalers (even only as needed), please bring them with you on the day of your procedure.  It was a pleasure to see you today!  Dr. Loletha Carrow

## 2020-07-25 ENCOUNTER — Ambulatory Visit (INDEPENDENT_AMBULATORY_CARE_PROVIDER_SITE_OTHER): Payer: 59 | Admitting: Psychology

## 2020-07-25 DIAGNOSIS — F4320 Adjustment disorder, unspecified: Secondary | ICD-10-CM | POA: Diagnosis not present

## 2020-08-02 ENCOUNTER — Ambulatory Visit (AMBULATORY_SURGERY_CENTER): Payer: 59 | Admitting: Gastroenterology

## 2020-08-02 ENCOUNTER — Encounter: Payer: Self-pay | Admitting: Gastroenterology

## 2020-08-02 ENCOUNTER — Other Ambulatory Visit: Payer: Self-pay

## 2020-08-02 VITALS — BP 127/85 | HR 71 | Temp 98.6°F | Resp 16 | Ht 68.5 in | Wt 189.0 lb

## 2020-08-02 DIAGNOSIS — K3189 Other diseases of stomach and duodenum: Secondary | ICD-10-CM | POA: Diagnosis not present

## 2020-08-02 DIAGNOSIS — R1319 Other dysphagia: Secondary | ICD-10-CM

## 2020-08-02 DIAGNOSIS — K222 Esophageal obstruction: Secondary | ICD-10-CM | POA: Diagnosis not present

## 2020-08-02 DIAGNOSIS — K2289 Other specified disease of esophagus: Secondary | ICD-10-CM | POA: Diagnosis not present

## 2020-08-02 DIAGNOSIS — K2100 Gastro-esophageal reflux disease with esophagitis, without bleeding: Secondary | ICD-10-CM | POA: Diagnosis not present

## 2020-08-02 DIAGNOSIS — K2970 Gastritis, unspecified, without bleeding: Secondary | ICD-10-CM

## 2020-08-02 MED ORDER — OMEPRAZOLE 40 MG PO CPDR: 40.0000 mg | DELAYED_RELEASE_CAPSULE | Freq: Every day | ORAL | 1 refills | Status: DC

## 2020-08-02 MED ORDER — SODIUM CHLORIDE 0.9 % IV SOLN: 500.0000 mL | Freq: Once | INTRAVENOUS | Status: DC

## 2020-08-02 NOTE — Patient Instructions (Signed)
Handouts on esophagitis and stricture and hiatal hernia given to you today  Await pathology results   YOU HAD AN ENDOSCOPIC PROCEDURE TODAY AT [facility]:   Refer to the procedure report that was given to you for any specific questions about what was found during the examination.  If the procedure report does not answer your questions, please call your gastroenterologist to clarify.  If you requested that your care partner not be given the details of your procedure findings, then the procedure report has been included in a sealed envelope for you to review at your convenience later.  YOU SHOULD EXPECT: Some feelings of bloating in the abdomen. Passage of more gas than usual.  Walking can help get rid of the air that was put into your GI tract during the procedure and reduce the bloating. If you had a lower endoscopy (such as a colonoscopy or flexible sigmoidoscopy) you may notice spotting of blood in your stool or on the toilet paper. If you underwent a bowel prep for your procedure, you may not have a normal bowel movement for a few days.  Please Note:  You might notice some irritation and congestion in your nose or some drainage.  This is from the oxygen used during your procedure.  There is no need for concern and it should clear up in a day or so.  SYMPTOMS TO REPORT IMMEDIATELY:   Following upper endoscopy (EGD)  Vomiting of blood or coffee ground material  New chest pain or pain under the shoulder blades  Painful or persistently difficult swallowing  New shortness of breath  Fever of 100F or higher  Black, tarry-looking stools  For urgent or emergent issues, a gastroenterologist can be reached at any hour by calling [phone]. Do not use MyChart messaging for urgent concerns.    DIET:  We do recommend a small meal at first, but then you may proceed to your regular diet.  Drink plenty of fluids but you should avoid alcoholic beverages for 24 hours.  ACTIVITY:   You should plan to take it easy for the rest of today and you should NOT DRIVE or use heavy machinery until tomorrow (because of the sedation medicines used during the test).    FOLLOW UP: Our staff will call the number listed on your records 48-72 hours following your procedure to check on you and address any questions or concerns that you may have regarding the information given to you following your procedure. If we do not reach you, we will leave a message.  We will attempt to reach you two times.  During this call, we will ask if you have developed any symptoms of COVID 19. If you develop any symptoms (ie: fever, flu-like symptoms, shortness of breath, cough etc.) before then, please call [phone].  If you test positive for Covid 19 in the 2 weeks post procedure, please call and report this information to [place].    If any biopsies were taken you will be contacted by phone or by letter within the next 1-3 weeks.  Please call us at [phone] if you have not heard about the biopsies in 3 weeks.    SIGNATURES/CONFIDENTIALITY: You and/or your care partner have signed paperwork which will be entered into your electronic medical record.  These signatures attest to the fact that that the information above on your After Visit Summary has been reviewed and is understood.  Full responsibility of the confidentiality of this discharge information lies with  you and/or your care-partner.

## 2020-08-02 NOTE — Progress Notes (Signed)
pt tolerated well. VSS. awake and to recovery. Report given to RN. Bite block left insitu to recovery. No trauma. 

## 2020-08-02 NOTE — Op Note (Signed)
[facility] Patient Name: Raymond Johnson Procedure Date: 08/02/2020 11:03 AM MRN: [id number] Endoscopist: Sherilyn Cooter L. Myrtie Neither , MD Age: 55 Referring MD:  Date of Birth: [birth year] Gender: Male Account #: [id number] Procedure:                Upper GI endoscopy Indications:              Esophageal dysphagia Medicines:                Monitored Anesthesia Care Procedure:                Pre-Anesthesia Assessment:                           - Prior to the procedure, a History and Physical                            was performed, and patient medications and                            allergies were reviewed. The patient's tolerance of                            previous anesthesia was also reviewed. The risks                            and benefits of the procedure and the sedation                            options and risks were discussed with the patient.                            All questions were answered, and informed consent                            was obtained. Prior Anticoagulants: The patient has                            taken no previous anticoagulant or antiplatelet                            agents. ASA Grade Assessment: II - A patient with                            mild systemic disease. After reviewing the risks                            and benefits, the patient was deemed in                            satisfactory condition to undergo the procedure.                           After obtaining informed consent, the endoscope was  passed under direct vision. Throughout the                            procedure, the patient's blood pressure, pulse, and                            oxygen saturations were monitored continuously. The                            Endoscope was introduced through the mouth, and                            advanced to the second part of duodenum. The upper                            GI endoscopy was accomplished  without difficulty.                            The patient tolerated the procedure well. Scope In: Scope Out: Findings:                 LA Grade D (one or more mucosal breaks involving at                            least 75% of esophageal circumference) esophagitis                            with no bleeding was found in the distal esophagus.                            Biopsies were taken from the EGJ with a cold                            forceps for histology. (r/o neoplasia, maredly                            inflamed)                           One benign-appearing, intrinsic moderate stenosis                            was found at the gastroesophageal junction. This                            stenosis measured 1 cm (inner diameter) x 1 cm (in                            length). The stenosis was traversed. A TTS dilator                            was passed through the scope. Dilation with a  13.5-14.5-15.5 mm balloon dilator was performed to                            15.5 mm. The dilation site was examined and showed                            moderate mucosal disruption and moderate                            improvement in luminal narrowing.                           A small hiatal hernia was present (1-2 cm).                           A few erosions with no bleeding and no stigmata of                            recent bleeding were found in the prepyloric region                            of the stomach. Several biopsies were obtained on                            the greater curvature of the gastric body, on the                            lesser curvature of the gastric body, on the                            greater curvature of the gastric antrum and on the                            lesser curvature of the gastric antrum with cold                            forceps for histology.                           The cardia and gastric fundus were normal on                             retroflexion. (Hill grade 3-4)                           The examined duodenum was normal. Complications:            No immediate complications. Estimated Blood Loss:     Estimated blood loss was minimal. Impression:               - LA Grade D reflux esophagitis with no bleeding.                            Biopsied.                           -  Benign-appearing esophageal stenosis. Dilated.                           - Small hiatal hernia.                           - Gastric erosions with no bleeding and no stigmata                            of recent bleeding.                           - Normal examined duodenum.                           - Several biopsies were obtained on the greater                            curvature of the gastric body, on the lesser                            curvature of the gastric body, on the greater                            curvature of the gastric antrum and on the lesser                            curvature of the gastric antrum. Recommendation:           - Patient has a contact number available for                            emergencies. The signs and symptoms of potential                            delayed complications were discussed with the                            patient. Return to normal activities tomorrow.                            Written discharge instructions were provided to the                            patient.                           - Resume previous diet.                           - Use Prilosec (omeprazole) 40 mg PO BID for 8                            weeks. Disp#60, RF 1                           -  Await pathology results.                           - Repeat upper endoscopy in 8 weeks to evaluate the                            response to therapy.                           - Follow an antireflux regimen indefinitely. Raymond Nazar L. Loletha Carrow, MD 08/02/2020 11:45:10 AM This report has been signed  electronically.

## 2020-08-02 NOTE — Progress Notes (Signed)
Called to room to assist during endoscopic procedure.  Patient ID and intended procedure confirmed with present staff. Received instructions for my participation in the procedure from the performing physician.  

## 2020-08-02 NOTE — Progress Notes (Signed)
VS taken by C.W. 

## 2020-08-05 ENCOUNTER — Ambulatory Visit (INDEPENDENT_AMBULATORY_CARE_PROVIDER_SITE_OTHER): Payer: 59 | Admitting: Psychology

## 2020-08-05 DIAGNOSIS — F4320 Adjustment disorder, unspecified: Secondary | ICD-10-CM | POA: Diagnosis not present

## 2020-08-06 ENCOUNTER — Telehealth: Payer: Self-pay | Admitting: *Deleted

## 2020-08-06 NOTE — Telephone Encounter (Signed)
  Follow up Call-  Call back number 08/02/2020  Post procedure Call Back phone  # [phone]  Permission to leave phone message Yes  Some recent data might be hidden     First attempt for follow up phone call. No answer at number given.  Left message on voicemail.

## 2020-08-06 NOTE — Telephone Encounter (Signed)
  Follow up Call-  Call back number 08/02/2020  Post procedure Call Back phone  # [phone]  Permission to leave phone message Yes  Some recent data might be hidden     Patient questions:  Message left to call us if necessary.  Second call.

## 2020-08-08 ENCOUNTER — Telehealth (HOSPITAL_COMMUNITY): Payer: 59 | Admitting: Psychiatry

## 2020-08-19 ENCOUNTER — Encounter: Payer: Self-pay | Admitting: Adult Health

## 2020-08-21 ENCOUNTER — Ambulatory Visit: Payer: 59 | Admitting: Psychology

## 2020-08-21 MED ORDER — TADALAFIL 20 MG PO TABS: 20.0000 mg | ORAL_TABLET | Freq: Every day | ORAL | 2 refills | Status: DC | PRN

## 2020-08-23 ENCOUNTER — Ambulatory Visit (INDEPENDENT_AMBULATORY_CARE_PROVIDER_SITE_OTHER): Payer: 59 | Admitting: Psychology

## 2020-08-23 DIAGNOSIS — F4320 Adjustment disorder, unspecified: Secondary | ICD-10-CM | POA: Diagnosis not present

## 2020-08-25 ENCOUNTER — Encounter: Payer: Self-pay | Admitting: Adult Health

## 2020-08-25 DIAGNOSIS — F4322 Adjustment disorder with anxiety: Secondary | ICD-10-CM

## 2020-08-27 MED ORDER — SERTRALINE HCL 50 MG PO TABS: 50.0000 mg | ORAL_TABLET | Freq: Every day | ORAL | 0 refills | Status: DC

## 2020-09-12 ENCOUNTER — Encounter: Payer: Self-pay | Admitting: Adult Health

## 2020-09-20 ENCOUNTER — Other Ambulatory Visit: Payer: Self-pay

## 2020-09-20 DIAGNOSIS — I10 Essential (primary) hypertension: Secondary | ICD-10-CM

## 2020-09-20 MED ORDER — AMLODIPINE BESY-BENAZEPRIL HCL 5-20 MG PO CAPS: 1.0000 | ORAL_CAPSULE | Freq: Every day | ORAL | 0 refills | Status: DC

## 2020-09-20 NOTE — Telephone Encounter (Signed)
Patient called back stating that the Rx was not sent to the pharmacy for his amLODipine-benazepril (LOTREL) 5-20 MG capsule    [facility], [place] Phone:  [phone]  Fax:  [phone]

## 2020-09-27 ENCOUNTER — Ambulatory Visit (INDEPENDENT_AMBULATORY_CARE_PROVIDER_SITE_OTHER): Payer: 59 | Admitting: Psychology

## 2020-09-27 DIAGNOSIS — F4320 Adjustment disorder, unspecified: Secondary | ICD-10-CM

## 2020-10-04 ENCOUNTER — Telehealth: Payer: Self-pay | Admitting: Gastroenterology

## 2020-10-04 NOTE — Telephone Encounter (Signed)
Please advise on refills. Per your last procedure note, pt to repeat EGD in 8 weeks for reassessment. He cx the pre visit and the procedure. Please advise on refill request.

## 2020-10-04 NOTE — Telephone Encounter (Signed)
Inbound call from pt stating that he needs a refill on Omeprezole. He asked if it can be sent to [facility] in [place]. Phone number [phone]. Please advise. Thanks.

## 2020-10-06 ENCOUNTER — Encounter: Payer: Self-pay | Admitting: Adult Health

## 2020-10-07 ENCOUNTER — Other Ambulatory Visit: Payer: Self-pay

## 2020-10-07 DIAGNOSIS — G4700 Insomnia, unspecified: Secondary | ICD-10-CM

## 2020-10-07 DIAGNOSIS — F4322 Adjustment disorder with anxiety: Secondary | ICD-10-CM

## 2020-10-07 MED ORDER — MIRTAZAPINE 7.5 MG PO TABS: 7.5000 mg | ORAL_TABLET | Freq: Every day | ORAL | 0 refills | Status: DC

## 2020-10-07 NOTE — Telephone Encounter (Signed)
The omeprazole can be refilled at a dose of 40 mg twice daily for another 2 months, with the understanding that he reschedule his upper endoscopy within that time period.  He had severe esophagitis and a stricture and he needs repeat endoscopic evaluation to make sure it has healed, make sure there are no other findings requiring additional biopsy or change in therapy, and to then be able to give best advice on long-term medical management of his reflux.  - HD

## 2020-10-08 ENCOUNTER — Other Ambulatory Visit: Payer: Self-pay

## 2020-10-08 MED ORDER — OMEPRAZOLE 40 MG PO CPDR: 40.0000 mg | DELAYED_RELEASE_CAPSULE | Freq: Every day | ORAL | 1 refills | Status: DC

## 2020-10-08 NOTE — Telephone Encounter (Signed)
Per mychart he is going to call and set up this appointment. I did call and got no answer. Refill for the 2 months at the twice a day doing has been completed.

## 2020-10-10 ENCOUNTER — Encounter: Payer: Self-pay | Admitting: Adult Health

## 2020-10-14 ENCOUNTER — Ambulatory Visit: Payer: 59 | Admitting: Family Medicine

## 2020-10-15 ENCOUNTER — Encounter: Payer: 59 | Admitting: Gastroenterology

## 2020-10-16 ENCOUNTER — Other Ambulatory Visit: Payer: Self-pay

## 2020-10-16 ENCOUNTER — Ambulatory Visit (INDEPENDENT_AMBULATORY_CARE_PROVIDER_SITE_OTHER): Payer: 59 | Admitting: Adult Health

## 2020-10-16 ENCOUNTER — Encounter: Payer: Self-pay | Admitting: Adult Health

## 2020-10-16 VITALS — BP 130/98 | HR 97 | Temp 98.6°F | Ht 69.0 in | Wt 188.0 lb

## 2020-10-16 DIAGNOSIS — Z125 Encounter for screening for malignant neoplasm of prostate: Secondary | ICD-10-CM | POA: Diagnosis not present

## 2020-10-16 DIAGNOSIS — E782 Mixed hyperlipidemia: Secondary | ICD-10-CM

## 2020-10-16 DIAGNOSIS — G4700 Insomnia, unspecified: Secondary | ICD-10-CM | POA: Diagnosis not present

## 2020-10-16 DIAGNOSIS — G4733 Obstructive sleep apnea (adult) (pediatric): Secondary | ICD-10-CM

## 2020-10-16 DIAGNOSIS — I10 Essential (primary) hypertension: Secondary | ICD-10-CM | POA: Diagnosis not present

## 2020-10-16 DIAGNOSIS — F4322 Adjustment disorder with anxiety: Secondary | ICD-10-CM

## 2020-10-16 DIAGNOSIS — M79674 Pain in right toe(s): Secondary | ICD-10-CM | POA: Diagnosis not present

## 2020-10-16 DIAGNOSIS — Z0000 Encounter for general adult medical examination without abnormal findings: Secondary | ICD-10-CM | POA: Diagnosis not present

## 2020-10-16 DIAGNOSIS — Z1159 Encounter for screening for other viral diseases: Secondary | ICD-10-CM

## 2020-10-16 DIAGNOSIS — Z114 Encounter for screening for human immunodeficiency virus [HIV]: Secondary | ICD-10-CM

## 2020-10-16 LAB — CBC WITH DIFFERENTIAL/PLATELET
Basophils Absolute: 0 10*3/uL (ref 0.0–0.1)
Basophils Relative: 1.1 % (ref 0.0–3.0)
Eosinophils Absolute: 0.1 10*3/uL (ref 0.0–0.7)
Eosinophils Relative: 3 % (ref 0.0–5.0)
HCT: 47.9 % (ref 39.0–52.0)
Hemoglobin: 16.6 g/dL (ref 13.0–17.0)
Lymphocytes Relative: 19.7 % (ref 12.0–46.0)
Lymphs Abs: 0.6 10*3/uL — ABNORMAL LOW (ref 0.7–4.0)
MCHC: 34.7 g/dL (ref 30.0–36.0)
MCV: 99.5 fl (ref 78.0–100.0)
Monocytes Absolute: 0.4 10*3/uL (ref 0.1–1.0)
Monocytes Relative: 12.5 % — ABNORMAL HIGH (ref 3.0–12.0)
Neutro Abs: 2.1 10*3/uL (ref 1.4–7.7)
Neutrophils Relative %: 63.7 % (ref 43.0–77.0)
Platelets: 122 10*3/uL — ABNORMAL LOW (ref 150.0–400.0)
RBC: 4.81 Mil/uL (ref 4.22–5.81)
RDW: 13.3 % (ref 11.5–15.5)
WBC: 3.3 10*3/uL — ABNORMAL LOW (ref 4.0–10.5)

## 2020-10-16 LAB — COMPREHENSIVE METABOLIC PANEL
ALT: 48 U/L (ref 0–53)
AST: 46 U/L — ABNORMAL HIGH (ref 0–37)
Albumin: 4.8 g/dL (ref 3.5–5.2)
Alkaline Phosphatase: 72 U/L (ref 39–117)
BUN: 15 mg/dL (ref 6–23)
CO2: 27 mEq/L (ref 19–32)
Calcium: 9.7 mg/dL (ref 8.4–10.5)
Chloride: 102 mEq/L (ref 96–112)
Creatinine, Ser: 1.09 mg/dL (ref 0.40–1.50)
GFR: 75.56 mL/min (ref >60.00)
Glucose, Bld: 93 mg/dL (ref 70–99)
Potassium: 4 mEq/L (ref 3.5–5.1)
Sodium: 139 mEq/L (ref 135–145)
Total Bilirubin: 1 mg/dL (ref 0.2–1.2)
Total Protein: 7.3 g/dL (ref 6.0–8.3)

## 2020-10-16 LAB — LIPID PANEL
Cholesterol: 181 mg/dL (ref 0–200)
HDL: 87.2 mg/dL (ref >39.00)
LDL Cholesterol: 82 mg/dL (ref 0–99)
NonHDL: 93.93
Total CHOL/HDL Ratio: 2
Triglycerides: 59 mg/dL (ref 0.0–149.0)
VLDL: 11.8 mg/dL (ref 0.0–40.0)

## 2020-10-16 LAB — TSH: TSH: 2.05 u[IU]/mL (ref 0.35–5.50)

## 2020-10-16 LAB — PSA: PSA: 0.76 ng/mL (ref 0.10–4.00)

## 2020-10-16 LAB — URIC ACID: Uric Acid, Serum: 7.1 mg/dL (ref 4.0–7.8)

## 2020-10-16 MED ORDER — OMEGA-3-ACID ETHYL ESTERS 1 G PO CAPS: 1.0000 g | ORAL_CAPSULE | Freq: Three times a day (TID) | ORAL | 3 refills | Status: DC

## 2020-10-16 MED ORDER — SERTRALINE HCL 50 MG PO TABS: 50.0000 mg | ORAL_TABLET | Freq: Every day | ORAL | 1 refills | Status: DC

## 2020-10-16 NOTE — Progress Notes (Signed)
Subjective:    Patient ID: Raymond Johnson, male    DOB: Feb 24, 1966, 55 y.o.   MRN: [id number]  HPI Patient presents for yearly preventative medicine examination. He is a pleasant 55 year old male who  has a past medical history of Anxiety, Chronic headaches, Diverticulitis, ED (erectile dysfunction), Elevated liver enzymes, Fatty liver, History of colonic polyps, History of kidney stones, HLD (hyperlipidemia), Hypertension, Seasonal allergies, and Sleep apnea.  Essential Hypertension -prescribed Lotrel 5-20 mg.  He denies dizziness, lightheadedness, chest pain, shortness of breath. BP Readings from Last 3 Encounters:  10/16/20 (!) 130/98  08/02/20 127/85  07/15/20 124/80   Anxiety -in therapy with a counselor and is also prescribed Zoloft 50 mg.  He does feel as though his anxiety is well controlled  Insomnia -takes Remeron 7.5 mg.  He does sleep well on this medication.  Denies side effects  Hyperlipidemia -prescribed Lovaza 1 g 3 times daily and Lipitor 10 mg. He denies myalgia or fatigue  Lab Results  Component Value Date   CHOL 178 08/29/2019   HDL 67.40 08/29/2019   LDLCALC 95 08/29/2019   TRIG 79.0 08/29/2019   CHOLHDL 3 08/29/2019   GERD- takes Prilosec 40 mg daily. Symptoms controlled. Had endoscopy in 07/2020 by Dr. Loletha Carrow which showed LA Grade D reflux esophagitis with no bleeding. Biopsied. - Benign-appearing esophageal stenosis. Dilated. - Small hiatal hernia. - Gastric erosions with no bleeding and no stigmata of recent bleeding. - Normal examined duodenum.  ED - prescribed Cialis 20 mg PRN  OSA - has not been wearing CPAP. Denies fatigue   Right foot pain -acute issue, started about a week ago.  When she first presented his right foot/great toe was swollen and painful to walk.  Does have a history of gout last flare about 10 years ago.  Symptoms improved slightly but he continues to have pain on the pad underneath the great toe.  All immunizations and health  maintenance protocols were reviewed with the patient and needed orders were placed.  Appropriate screening laboratory values were ordered for the patient including screening of hyperlipidemia, renal function and hepatic function. If indicated by BPH, a PSA was ordered.  Medication reconciliation,  past medical history, social history, problem list and allergies were reviewed in detail with the patient  Goals were established with regard to weight loss, exercise, and  diet in compliance with medications.  Tries to stay active and eat a heart healthy diet. Wt Readings from Last 3 Encounters:  10/16/20 188 lb (85.3 kg)  08/02/20 189 lb (85.7 kg)  07/15/20 189 lb (85.7 kg)   He is up-to-date on routine colon cancer screening  Review of Systems  Constitutional: Negative.   HENT: Negative.    Eyes: Negative.   Respiratory: Negative.    Cardiovascular: Negative.   Gastrointestinal: Negative.   Endocrine: Negative.   Genitourinary: Negative.   Musculoskeletal:  Positive for arthralgias and joint swelling.  Skin: Negative.   Allergic/Immunologic: Negative.   Neurological: Negative.   Hematological: Negative.   Psychiatric/Behavioral: Negative.    All other systems reviewed and are negative. Past Medical History:  Diagnosis Date   Anxiety    Chronic headaches    stress headaches   Diverticulitis    ED (erectile dysfunction)    Elevated liver enzymes    history being elevated   Fatty liver    History of colonic polyps    History of kidney stones    HLD (hyperlipidemia)    Hypertension  Seasonal allergies    Sleep apnea     Social History   Socioeconomic History   Marital status: Single    Spouse name: Not on file   Number of children: 3   Years of education: Not on file   Highest education level: Not on file  Occupational History   Occupation: Sales  Tobacco Use   Smoking status: Former    Packs/day: 0.25    Years: 31.00    Pack years: 7.75    Types: Cigarettes     Start date: 11    Quit date: 2014    Years since quitting: 8.5   Smokeless tobacco: Never   Tobacco comments:    quit from age 1 for 4 years   Vaping Use   Vaping Use: Never used  Substance and Sexual Activity   Alcohol use: Yes    Comment: "socially"   Drug use: No   Sexual activity: Not on file  Other Topics Concern   Not on file  Social History Narrative   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, drilling      Not married - Divorced      Three children - 2 live locally, one in [place] Strain: Not on file  Food Insecurity: Not on file  Transportation Needs: Not on file  Physical Activity: Not on file  Stress: Not on file  Social Connections: Not on file  Intimate Partner Violence: Not on file    Past Surgical History:  Procedure Laterality Date   COLONOSCOPY  2016   [place] LITHOTRIPSY Left 01/28/2017   Procedure: LEFT EXTRACORPOREAL SHOCK WAVE LITHOTRIPSY (ESWL);  Surgeon: Irine Seal, MD;  Location: [facility];  Service: Urology;  Laterality: Left;   HERNIA REPAIR     at age 57 years   skin cancer       Family History  Problem Relation Age of Onset   Breast cancer Mother    Lung cancer Father    Prostate cancer Brother 76   Cancer Paternal Grandfather        ? colon    Colon cancer Neg Hx    Esophageal cancer Neg Hx    Rectal cancer Neg Hx    Stomach cancer Neg Hx     No Known Allergies  Current Outpatient Medications on File Prior to Visit  Medication Sig Dispense Refill   amLODipine-benazepril (LOTREL) 5-20 MG capsule Take 1 capsule by mouth daily. 90 capsule 0   atorvastatin (LIPITOR) 10 MG tablet Take 1 tablet (10 mg total) by mouth daily. (Patient taking differently: No sig reported) 90 tablet 3   doxycycline (ORACEA) 40 MG capsule Take 40 mg by mouth daily.     fluticasone (FLONASE) 50 MCG/ACT nasal spray Place 1 spray  into both nostrils daily. 48 g 1   mirtazapine (REMERON) 7.5 MG tablet Take 1 tablet (7.5 mg total) by mouth at bedtime. 90 tablet 0   omega-3 acid ethyl esters (LOVAZA) 1 g capsule Take 1 capsule (1 g total) by mouth 3 (three) times daily. 270 capsule 3   omeprazole (PRILOSEC) 40 MG capsule Take 1 capsule (40 mg total) by mouth daily. Take one by mouth twice a day 60 capsule 1   Probiotic Product (PROBIOTIC PO) Take by mouth.     RHOFADE 1 % CREA Apply 1 application  topically every morning.     sertraline (ZOLOFT) 50 MG tablet Take 1 tablet (50 mg total) by mouth daily. 90 tablet 0   tadalafil (CIALIS) 20 MG tablet Take 1 tablet (20 mg total) by mouth daily as needed. 10 tablet 2   triamcinolone cream (KENALOG) 0.1 % Apply topically.     No current facility-administered medications on file prior to visit.    BP (!) 130/98   Pulse 97   Temp 98.6 F (37 C) (Oral)   Ht '5\' 9"'$  (1.753 m)   Wt 188 lb (85.3 kg)   SpO2 96%   BMI 27.76 kg/m       Objective:   Physical Exam Vitals and nursing note reviewed.  Constitutional:      General: He is not in acute distress.    Appearance: Normal appearance. He is well-developed and normal weight.  HENT:     Head: Normocephalic and atraumatic.     Right Ear: Tympanic membrane, ear canal and external ear normal. There is no impacted cerumen.     Left Ear: Tympanic membrane, ear canal and external ear normal. There is no impacted cerumen.     Nose: Nose normal. No congestion or rhinorrhea.     Mouth/Throat:     Mouth: Mucous membranes are moist.     Pharynx: Oropharynx is clear. No oropharyngeal exudate or posterior oropharyngeal erythema.  Eyes:     General:        Right eye: No discharge.        Left eye: No discharge.     Extraocular Movements: Extraocular movements intact.     Conjunctiva/sclera: Conjunctivae normal.     Pupils: Pupils are equal, round, and reactive to light.  Neck:     Vascular: No carotid bruit.     Trachea: No  tracheal deviation.  Cardiovascular:     Rate and Rhythm: Normal rate and regular rhythm.     Pulses: Normal pulses.     Heart sounds: Normal heart sounds. No murmur heard.   No friction rub. No gallop.  Pulmonary:     Effort: Pulmonary effort is normal. No respiratory distress.     Breath sounds: Normal breath sounds. No stridor. No wheezing, rhonchi or rales.  Chest:     Chest wall: No tenderness.  Abdominal:     General: Bowel sounds are normal. There is no distension.     Palpations: Abdomen is soft. There is no mass.     Tenderness: There is no abdominal tenderness. There is no right CVA tenderness, left CVA tenderness, guarding or rebound.     Hernia: No hernia is present.  Musculoskeletal:        General: No swelling, tenderness, deformity or signs of injury. Normal range of motion.     Right lower leg: No edema.     Left lower leg: No edema.     Right foot: Bunion present.     Left foot: Bunion present.  Feet:     Right foot:     Skin integrity: Skin integrity normal.     Left foot:     Skin integrity: Skin integrity normal.     Comments: Has discomfort with palpation along the pad of the right great toe. Lymphadenopathy:     Cervical: No cervical adenopathy.  Skin:    General: Skin is warm and dry.     Capillary Refill: Capillary refill takes less than 2 seconds.     Coloration: Skin is not jaundiced  or pale.     Findings: No bruising, erythema, lesion or rash.  Neurological:     General: No focal deficit present.     Mental Status: He is alert and oriented to person, place, and time.     Cranial Nerves: No cranial nerve deficit.     Sensory: No sensory deficit.     Motor: No weakness.     Coordination: Coordination normal.     Gait: Gait normal.     Deep Tendon Reflexes: Reflexes normal.  Psychiatric:        Mood and Affect: Mood normal.        Behavior: Behavior normal.        Thought Content: Thought content normal.        Judgment: Judgment normal.        Assessment & Plan:  1. Routine general medical examination at a health care facility -Continue lifestyle modifications -Follow-up in 1 year - CBC with Differential/Platelet; Future - Comprehensive metabolic panel; Future - Lipid panel; Future - TSH; Future - TSH - Lipid panel - Comprehensive metabolic panel - CBC with Differential/Platelet  2. Prostate cancer screening  - PSA; Future - PSA  3. Essential hypertension - Well controlled. No change in medication  - CBC with Differential/Platelet; Future - Comprehensive metabolic panel; Future - Lipid panel; Future - TSH; Future - TSH - Lipid panel - Comprehensive metabolic panel - CBC with Differential/Platelet  4. Mixed hyperlipidemia - Consider adding zetia - CBC with Differential/Platelet; Future - Comprehensive metabolic panel; Future - Lipid panel; Future - TSH; Future - omega-3 acid ethyl esters (LOVAZA) 1 g capsule; Take 1 capsule (1 g total) by mouth 3 (three) times daily.  Dispense: 270 capsule; Refill: 3 - TSH - Lipid panel - Comprehensive metabolic panel - CBC with Differential/Platelet  5. Insomnia, unspecified type - Continue with remeron  - CBC with Differential/Platelet; Future - Comprehensive metabolic panel; Future - Lipid panel; Future - TSH; Future - TSH - Lipid panel - Comprehensive metabolic panel - CBC with Differential/Platelet  6. Adjustment disorder with anxious mood - Continue with Zoloft and therapy - CBC with Differential/Platelet; Future - Comprehensive metabolic panel; Future - Lipid panel; Future - TSH; Future - sertraline (ZOLOFT) 50 MG tablet; Take 1 tablet (50 mg total) by mouth daily.  Dispense: 90 tablet; Refill: 1 - TSH - Lipid panel - Comprehensive metabolic panel - CBC with Differential/Platelet  7. OSA (obstructive sleep apnea) - wear cpap   8. Need for hepatitis C screening test  - Hep C Antibody; Future - Hep C Antibody  9. Encounter for screening for  HIV  - HIV Antibody (routine testing w rflx); Future - HIV Antibody (routine testing w rflx)  10. Great toe pain, right -Do not believe this is a gout flare.  Likely osteoarthritis or bone spur.  We will get x-ray of foot and decide on treatment options once the x-ray has been resulted - Uric Acid; Future - DG Foot Complete Right; Future - Uric Acid  [name], NP

## 2020-10-17 LAB — HEPATITIS C ANTIBODY
Hepatitis C Ab: NONREACTIVE
SIGNAL TO CUT-OFF: 0.01 (ref <1.00)

## 2020-10-17 LAB — HIV ANTIBODY (ROUTINE TESTING W REFLEX): HIV 1&2 Ab, 4th Generation: NONREACTIVE

## 2020-10-18 ENCOUNTER — Telehealth: Payer: Self-pay | Admitting: Adult Health

## 2020-10-18 ENCOUNTER — Other Ambulatory Visit: Payer: Self-pay | Admitting: Adult Health

## 2020-10-18 MED ORDER — ATORVASTATIN CALCIUM 10 MG PO TABS: 10.0000 mg | ORAL_TABLET | Freq: Every day | ORAL | 3 refills | Status: DC

## 2020-10-18 NOTE — Telephone Encounter (Signed)
atorvastatin (LIPITOR) 10 MG tablet  [facility] [id number] - [address], [address] Phone:  [phone]  Fax:  [phone]      The patient is out of this medication and the [facility] is closed on the weekend.

## 2020-10-25 ENCOUNTER — Encounter: Payer: Self-pay | Admitting: Gastroenterology

## 2020-10-25 ENCOUNTER — Ambulatory Visit (INDEPENDENT_AMBULATORY_CARE_PROVIDER_SITE_OTHER)
Admission: RE | Admit: 2020-10-25 | Discharge: 2020-10-25 | Disposition: A | Discharge status: Home / Self Care | Payer: 59 | Source: Ambulatory Visit | Attending: Adult Health | Admitting: Adult Health

## 2020-10-25 ENCOUNTER — Ambulatory Visit (INDEPENDENT_AMBULATORY_CARE_PROVIDER_SITE_OTHER): Payer: 59

## 2020-10-25 ENCOUNTER — Other Ambulatory Visit: Payer: Self-pay

## 2020-10-25 DIAGNOSIS — Z23 Encounter for immunization: Secondary | ICD-10-CM | POA: Diagnosis not present

## 2020-10-25 DIAGNOSIS — M79674 Pain in right toe(s): Secondary | ICD-10-CM | POA: Diagnosis not present

## 2020-10-25 NOTE — Progress Notes (Signed)
Patient came in for his Shingrix injection. Injection given in the right deltoid, pt tolerated injection well. He will return in 2-6 months for the 2nd injection. No signs/symptoms of a reaction prior to leaving the office. Pt also mentioned a muscle ache in his back; he has been taking nsaids with little relief. Advised he could try using icy hot or voltaren over the counter to see if those help.

## 2020-10-29 ENCOUNTER — Encounter: Payer: Self-pay | Admitting: Adult Health

## 2020-10-30 ENCOUNTER — Other Ambulatory Visit: Payer: Self-pay | Admitting: Adult Health

## 2020-10-30 MED ORDER — PREDNISONE 20 MG PO TABS: 20.0000 mg | ORAL_TABLET | Freq: Every day | ORAL | 0 refills | Status: DC

## 2020-11-06 ENCOUNTER — Encounter: Payer: Self-pay | Admitting: Adult Health

## 2020-11-07 ENCOUNTER — Other Ambulatory Visit: Payer: Self-pay | Admitting: Adult Health

## 2020-11-07 DIAGNOSIS — M79674 Pain in right toe(s): Secondary | ICD-10-CM

## 2020-11-07 NOTE — Telephone Encounter (Signed)
Lm on vm for patient to return call 

## 2020-11-07 NOTE — Telephone Encounter (Signed)
Pt returned call, he states that he needs a refill on Omeprazole 40 mg BID, advised that he should have 1 additional refill left from his July prescription. Advised that if he does have 1 refill left the pharmacy will fill that and we will send in an RX for an additional month until his procedure if OK with Dr. Loletha Carrow. Pt will check with his pharmacy and will let me know later today.

## 2020-11-22 ENCOUNTER — Ambulatory Visit: Payer: 59 | Admitting: Podiatry

## 2020-11-22 ENCOUNTER — Encounter: Payer: Self-pay | Admitting: Podiatry

## 2020-11-22 ENCOUNTER — Other Ambulatory Visit: Payer: Self-pay

## 2020-11-22 DIAGNOSIS — M1A0710 Idiopathic chronic gout, right ankle and foot, without tophus (tophi): Secondary | ICD-10-CM

## 2020-11-22 DIAGNOSIS — M205X1 Other deformities of toe(s) (acquired), right foot: Secondary | ICD-10-CM

## 2020-11-25 NOTE — Progress Notes (Signed)
Subjective:   Patient ID: Raymond Johnson, male   DOB: 55 y.o.   MRN: [id number]   HPI Patient states he has a lot of enlargement around his big toe joint on his right foot that is gotten gradually worse over the last period of time and he had pain a few weeks ago and is concerned about the discomfort he was experiencing.  Patient states he tries to be active it is somewhat inhibiting but not bad and patient does not smoke and likes to be active   Review of Systems  All other systems reviewed and are negative.      Objective:  Physical Exam Vitals and nursing note reviewed.  Constitutional:      Appearance: He is well-developed.  Pulmonary:     Effort: Pulmonary effort is normal.  Musculoskeletal:        General: Normal range of motion.  Skin:    General: Skin is warm.  Neurological:     Mental Status: He is alert.    Neurovascular status found to be intact muscle strength was found to be adequate range of motion adequate.  Patient is noted to have a stream enlargement around the first MPJ right with spur formation dorsally and significant reduction of the range of motion of the joint surface.  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  Advised patient on this and hold off currently and keep evaluating this as needed

## 2020-12-16 ENCOUNTER — Other Ambulatory Visit: Payer: Self-pay | Admitting: Adult Health

## 2020-12-16 DIAGNOSIS — I10 Essential (primary) hypertension: Secondary | ICD-10-CM

## 2020-12-27 ENCOUNTER — Ambulatory Visit: Payer: 59 | Admitting: Gastroenterology

## 2021-01-03 ENCOUNTER — Encounter: Payer: Self-pay | Admitting: Gastroenterology

## 2021-01-03 ENCOUNTER — Other Ambulatory Visit: Payer: Self-pay

## 2021-01-03 ENCOUNTER — Ambulatory Visit (AMBULATORY_SURGERY_CENTER): Payer: 59 | Admitting: *Deleted

## 2021-01-03 VITALS — Ht 69.0 in | Wt 187.0 lb

## 2021-01-03 DIAGNOSIS — K2100 Gastro-esophageal reflux disease with esophagitis, without bleeding: Secondary | ICD-10-CM

## 2021-01-03 NOTE — Progress Notes (Signed)
No egg or soy allergy known to patient  No issues known to pt with past sedation with any surgeries or procedures Patient denies ever being told they had issues or difficulty with intubation  No FH of Malignant Hyperthermia Pt is not on diet pills Pt is not on  home 02  Pt is not on blood thinners  Pt denies issues with constipation  No A fib or A flutter  Pt is fully vaccinated  for Covid    Due to the COVID-19 pandemic we are asking patients to follow certain guidelines in PV and the [place]   Pt aware of COVID protocols and [facility] guidelines   Pt verified name, DOB, address and insurance during PV today.  Pt mailed instruction packet of Emmi video, copy of consent form to read and not return, and instructions.  PV completed over the phone.  Pt encouraged to call with questions or issues.  My Chart instructions to pt as well

## 2021-01-10 ENCOUNTER — Encounter: Payer: Self-pay | Admitting: Adult Health

## 2021-01-10 ENCOUNTER — Other Ambulatory Visit: Payer: Self-pay | Admitting: Adult Health

## 2021-01-10 DIAGNOSIS — G4700 Insomnia, unspecified: Secondary | ICD-10-CM

## 2021-01-10 DIAGNOSIS — F4322 Adjustment disorder with anxiety: Secondary | ICD-10-CM

## 2021-01-15 ENCOUNTER — Other Ambulatory Visit: Payer: Self-pay | Admitting: Adult Health

## 2021-01-15 MED ORDER — ATORVASTATIN CALCIUM 10 MG PO TABS: 10.0000 mg | ORAL_TABLET | Freq: Every day | ORAL | 2 refills | Status: DC

## 2021-01-16 ENCOUNTER — Ambulatory Visit (INDEPENDENT_AMBULATORY_CARE_PROVIDER_SITE_OTHER): Payer: 59

## 2021-01-16 ENCOUNTER — Other Ambulatory Visit: Payer: Self-pay

## 2021-01-16 DIAGNOSIS — Z23 Encounter for immunization: Secondary | ICD-10-CM | POA: Diagnosis not present

## 2021-01-17 ENCOUNTER — Ambulatory Visit (AMBULATORY_SURGERY_CENTER): Payer: 59 | Admitting: Gastroenterology

## 2021-01-17 ENCOUNTER — Encounter: Payer: Self-pay | Admitting: Gastroenterology

## 2021-01-17 VITALS — BP 96/57 | HR 74 | Temp 98.9°F | Resp 17 | Ht 69.0 in | Wt 187.0 lb

## 2021-01-17 DIAGNOSIS — R1319 Other dysphagia: Secondary | ICD-10-CM | POA: Diagnosis present

## 2021-01-17 DIAGNOSIS — K2100 Gastro-esophageal reflux disease with esophagitis, without bleeding: Secondary | ICD-10-CM

## 2021-01-17 MED ORDER — SODIUM CHLORIDE 0.9 % IV SOLN: 500.0000 mL | Freq: Once | INTRAVENOUS | Status: DC

## 2021-01-17 NOTE — Progress Notes (Signed)
A and O x3. Report to RN. Tolerated MAC anesthesia well.Teeth unchanged after procedure. 

## 2021-01-17 NOTE — Op Note (Signed)
[place] Patient Name: Raymond Johnson Procedure Date: 01/17/2021 9:35 AM MRN: [id number] Endoscopist: Mallie Mussel L. Loletha Carrow , MD Age: 55 Referring MD:  Date of Birth: 12-14-1965 Gender: Male Account #: [id number] Procedure:                Upper GI endoscopy Indications:              Follow-up of reflux esophagitis                           Severe reflux esophagitis with stricture requiring                            balloon dilation May 2022                           Currently without heartburn or dysphagia on twice                            daily PPI Medicines:                Monitored Anesthesia Care Procedure:                Pre-Anesthesia Assessment:                           - Prior to the procedure, a History and Physical                            was performed, and patient medications and                            allergies were reviewed. The patient's tolerance of                            previous anesthesia was also reviewed. The risks                            and benefits of the procedure and the sedation                            options and risks were discussed with the patient.                            All questions were answered, and informed consent                            was obtained. Prior Anticoagulants: The patient has                            taken no previous anticoagulant or antiplatelet                            agents. ASA Grade Assessment: II - A patient with  mild systemic disease. After reviewing the risks                            and benefits, the patient was deemed in                            satisfactory condition to undergo the procedure.                           After obtaining informed consent, the endoscope was                            passed under direct vision. Throughout the                            procedure, the patient's blood pressure, pulse, and                            oxygen  saturations were monitored continuously. The                            Endoscope was introduced through the mouth, and                            advanced to the second part of duodenum. The upper                            GI endoscopy was accomplished without difficulty.                            The patient tolerated the procedure well. Scope In: Scope Out: Findings:                 A 1 cm sliding hiatal hernia was present.                           There is no endoscopic evidence of Barrett's                            esophagus, esophagitis or stricture in the entire                            esophagus.                           The stomach was normal.                           The cardia and gastric fundus were normal on                            retroflexion. (Hill grade 3)                           The examined duodenum was normal. Complications:  No immediate complications. Estimated Blood Loss:     Estimated blood loss: none. Impression:               - 1 cm hiatal hernia.                           - Normal stomach.                           - Normal examined duodenum.                           - No specimens collected. Recommendation:           - Patient has a contact number available for                            emergencies. The signs and symptoms of potential                            delayed complications were discussed with the                            patient. Return to normal activities tomorrow.                            Written discharge instructions were provided to the                            patient.                           - Resume previous diet.                           - Reduce omeprazole 40 mg from twice daily to once                            daily. (send refill request as needed).                           - Return to my office in 3-4 months. (call sooner                            if recurring heartburn or dysphagia) Min Collymore L.  Loletha Carrow, MD 01/17/2021 10:01:48 AM This report has been signed electronically.

## 2021-01-17 NOTE — Progress Notes (Signed)
History and Physical:  This patient presents for endoscopic testing for: Encounter Diagnoses  Name Primary?   Esophageal dysphagia Yes   Gastroesophageal reflux disease with esophagitis without hemorrhage    Severe reflux esophagitis with stricture requiring balloon dilation May 2022 Patient currently denies heartburn or dysphagia on twice daily PPI  ROS: Patient denies chest pain or cough   Past Medical History: Past Medical History:  Diagnosis Date   Allergy    Anxiety    Chronic headaches    stress headaches   Diverticulitis    ED (erectile dysfunction)    Elevated liver enzymes    history being elevated   Fatty liver    GERD (gastroesophageal reflux disease)    Gout    >10 years from last episode   History of colonic polyps    History of kidney stones    HLD (hyperlipidemia)    Hypertension    Seasonal allergies    Sleep apnea    not using CPAP     Past Surgical History: Past Surgical History:  Procedure Laterality Date   COLONOSCOPY  2016   [facility]    COLONOSCOPY     EXTRACORPOREAL SHOCK WAVE LITHOTRIPSY Left 01/28/2017   Procedure: LEFT EXTRACORPOREAL SHOCK WAVE LITHOTRIPSY (ESWL);  Surgeon: Irine Seal, MD;  Location: [facility];  Service: Urology;  Laterality: Left;   HERNIA REPAIR     at age 54 years   POLYPECTOMY     skin cancer      not cancer- benign   UPPER GASTROINTESTINAL ENDOSCOPY      Allergies: No Known Allergies  Outpatient Meds: Current Outpatient Medications  Medication Sig Dispense Refill   amLODipine-benazepril (LOTREL) 5-20 MG capsule Take 1 capsule by mouth daily. 90 capsule 0   atorvastatin (LIPITOR) 10 MG tablet Take 1 tablet (10 mg total) by mouth daily. 90 tablet 2   doxycycline (ORACEA) 40 MG capsule Take 40 mg by mouth daily.     fluticasone (FLONASE) 50 MCG/ACT nasal spray Place 1 spray into both nostrils daily. 48 g 1   mirtazapine (REMERON) 7.5 MG tablet Take 1 tablet (7.5 mg total) by mouth at bedtime. 90 tablet  0   omega-3 acid ethyl esters (LOVAZA) 1 g capsule Take 1 capsule (1 g total) by mouth 3 (three) times daily. 270 capsule 3   omeprazole (PRILOSEC) 40 MG capsule Take 1 capsule (40 mg total) by mouth daily. Take one by mouth twice a day 60 capsule 1   Probiotic Product (PROBIOTIC PO) Take by mouth.     RHOFADE 1 % CREA Apply 1 application topically every morning.     tadalafil (CIALIS) 20 MG tablet Take 1 tablet (20 mg total) by mouth daily as needed. 10 tablet 2   triamcinolone cream (KENALOG) 0.1 % Apply topically.     sertraline (ZOLOFT) 50 MG tablet Take 1 tablet (50 mg total) by mouth daily. 90 tablet 1   Current Facility-Administered Medications  Medication Dose Route Frequency Provider Last Rate Last Admin   0.9 %  sodium chloride infusion  500 mL Intravenous Once Nelida Meuse III, MD          ___________________________________________________________________ Objective   Exam:  BP (!) 139/93   Pulse 66   Temp 98.9 F (37.2 C) (Temporal)   Resp (!) 7   Ht 5\' 9"  (1.753 m)   Wt 187 lb (84.8 kg)   SpO2 98%   BMI 27.62 kg/m   CV: RRR without murmur,  S1/S2 Resp: clear to auscultation bilaterally, normal RR and effort noted GI: soft, no tenderness, with active bowel sounds.   Assessment: Encounter Diagnoses  Name Primary?   Esophageal dysphagia Yes   Gastroesophageal reflux disease with esophagitis without hemorrhage      Plan:  EGD  The benefits and risks of the planned procedure were described in detail with the patient or (when appropriate) their health care proxy.  Risks were outlined as including, but not limited to, bleeding, infection, perforation, adverse medication reaction leading to cardiac or pulmonary decompensation, pancreatitis (if ERCP).  The limitation of incomplete mucosal visualization was also discussed.  No guarantees or warranties were given.     The patient is appropriate for an endoscopic procedure in the ambulatory setting.   - Wilfrid Lund, MD

## 2021-01-17 NOTE — Patient Instructions (Signed)
YOU HAD AN ENDOSCOPIC PROCEDURE TODAY AT [facility]:   Refer to the procedure report that was given to you for any specific questions about what was found during the examination.  If the procedure report does not answer your questions, please call your gastroenterologist to clarify.  If you requested that your care partner not be given the details of your procedure findings, then the procedure report has been included in a sealed envelope for you to review at your convenience later.  YOU SHOULD EXPECT: Some feelings of bloating in the abdomen. Passage of more gas than usual.  Walking can help get rid of the air that was put into your GI tract during the procedure and reduce the bloating. If you had a lower endoscopy (such as a colonoscopy or flexible sigmoidoscopy) you may notice spotting of blood in your stool or on the toilet paper. If you underwent a bowel prep for your procedure, you may not have a normal bowel movement for a few days.  Please Note:  You might notice some irritation and congestion in your nose or some drainage.  This is from the oxygen used during your procedure.  There is no need for concern and it should clear up in a day or so.  SYMPTOMS TO REPORT IMMEDIATELY:   Following upper endoscopy (EGD)  Vomiting of blood or coffee ground material  New chest pain or pain under the shoulder blades  Painful or persistently difficult swallowing  New shortness of breath  Fever of 100F or higher  Black, tarry-looking stools  For urgent or emergent issues, a gastroenterologist can be reached at any hour by calling [phone]. Do not use MyChart messaging for urgent concerns.    DIET:  We do recommend a small meal at first, but then you may proceed to your regular diet.  Drink plenty of fluids but you should avoid alcoholic beverages for 24 hours.  MEDICATIONS: Reduce omeprazole 40 mg from twice daily to once daily. (Send refill request as needed).  FOLLOW UP:  Follow up with Dr. Loletha Carrow in 3-4 months, call sooner if you have recurring heartburn or dysphagia).  Please see handouts given to you by your recovery nurse.  Thank you for allowing [place] to provide for your healthcare needs today.  ACTIVITY:  You should plan to take it easy for the rest of today and you should NOT DRIVE or use heavy machinery until tomorrow (because of the sedation medicines used during the test).    FOLLOW UP: Our staff will call the number listed on your records 48-72 hours following your procedure to check on you and address any questions or concerns that you may have regarding the information given to you following your procedure. If we do not reach you, we will leave a message.  We will attempt to reach you two times.  During this call, we will ask if you have developed any symptoms of COVID 19. If you develop any symptoms (ie: fever, flu-like symptoms, shortness of breath, cough etc.) before then, please call [phone].  If you test positive for Covid 19 in the 2 weeks post procedure, please call and report this information to [place].    If any biopsies were taken you will be contacted by phone or by letter within the next 1-3 weeks.  Please call us at [phone] if you have not heard about the biopsies in 3 weeks.    SIGNATURES/CONFIDENTIALITY: You and/or your care partner have signed paperwork which  will be entered into your electronic medical record.  These signatures attest to the fact that that the information above on your After Visit Summary has been reviewed and is understood.  Full responsibility of the confidentiality of this discharge information lies with you and/or your care-partner.

## 2021-01-17 NOTE — Progress Notes (Signed)
VS completed by DT.  Pt's states no medical or surgical changes since previsit or office visit.  

## 2021-03-04 ENCOUNTER — Other Ambulatory Visit: Payer: Self-pay | Admitting: Adult Health

## 2021-03-04 DIAGNOSIS — E782 Mixed hyperlipidemia: Secondary | ICD-10-CM

## 2021-03-11 ENCOUNTER — Other Ambulatory Visit: Payer: Self-pay

## 2021-03-11 ENCOUNTER — Telehealth: Payer: Self-pay | Admitting: Gastroenterology

## 2021-03-11 MED ORDER — OMEPRAZOLE 40 MG PO CPDR: 40.0000 mg | DELAYED_RELEASE_CAPSULE | Freq: Every day | ORAL | 1 refills | Status: DC

## 2021-03-11 NOTE — Telephone Encounter (Signed)
Patient called and stated that he never got a refill after is procedure back in October for Prilosec, requesting another refill.   Wants medication sent to: [facility] on [address] in [place]   Please advise

## 2021-03-11 NOTE — Telephone Encounter (Signed)
Refilled as advised on the EGD report. Omperazole 40 mg once a day.

## 2021-03-17 ENCOUNTER — Other Ambulatory Visit: Payer: Self-pay | Admitting: Adult Health

## 2021-03-17 DIAGNOSIS — I10 Essential (primary) hypertension: Secondary | ICD-10-CM

## 2021-03-18 ENCOUNTER — Telehealth: Payer: Self-pay | Admitting: Adult Health

## 2021-03-18 MED ORDER — AMLODIPINE BESY-BENAZEPRIL HCL 5-20 MG PO CAPS: 1.0000 | ORAL_CAPSULE | Freq: Every day | ORAL | 2 refills | Status: DC

## 2021-03-18 NOTE — Telephone Encounter (Signed)
Pharmacist with [facility] located at [address] [place] in [place] Ph: [phone] RX was transferred to them from another location and the RX is not transferring. Pharmacist Lisabeth Devoid is requesting that we resend RX directly to there location  RX is the Amlodipine benazepril 5-20 mg capsule

## 2021-03-24 ENCOUNTER — Encounter: Payer: Self-pay | Admitting: Adult Health

## 2021-04-09 ENCOUNTER — Encounter: Payer: Self-pay | Admitting: Gastroenterology

## 2021-04-21 ENCOUNTER — Encounter: Payer: Self-pay | Admitting: Adult Health

## 2021-04-22 MED ORDER — FLUTICASONE PROPIONATE 50 MCG/ACT NA SUSP: 1.0000 | Freq: Every day | NASAL | 1 refills | Status: DC

## 2021-05-23 ENCOUNTER — Encounter: Payer: Self-pay | Admitting: Gastroenterology

## 2021-05-23 ENCOUNTER — Ambulatory Visit: Payer: 59 | Admitting: Gastroenterology

## 2021-05-23 VITALS — BP 142/66 | HR 70 | Ht 70.0 in | Wt 193.0 lb

## 2021-05-23 DIAGNOSIS — K2100 Gastro-esophageal reflux disease with esophagitis, without bleeding: Secondary | ICD-10-CM | POA: Diagnosis not present

## 2021-05-23 NOTE — Progress Notes (Signed)
? ? ? ?[facility] Progress Note ? ?Chief Complaint: GERD ? ?Subjective  ?History: ?Raymond Johnson was seen May of last year for chronic reflux symptoms and dysphagia.  EGD showed severe reflux esophagitis and stricture requiring balloon dilation.  He was on twice daily PPI afterward, and had a repeat upper endoscopy in October showing complete healing of the esophagitis, no stricture, and a 1 cm hiatal hernia.  Omeprazole 40 mg decreased to once daily. ? ?He is glad to report no heartburn or dysphagia taking omeprazole 40 mg daily.  He is doing his best to engage in antireflux diet and lifestyle measures.  He wanted to talk about the long-term management of his GERD, and he is not certain he wants to be on medicine long-term. ? ?ROS: ?Cardiovascular:  no chest pain ?Respiratory: no dyspnea ? ?The patient's Past Medical, Family and Social History were reviewed and are on file in the EMR. ? ?Objective: ? ?Med list reviewed ? ?Current Outpatient Medications:  ?  amLODipine-benazepril (LOTREL) 5-20 MG capsule, Take 1 capsule by mouth daily., Disp: 90 capsule, Rfl: 2 ?  atorvastatin (LIPITOR) 10 MG tablet, Take 1 tablet (10 mg total) by mouth daily., Disp: 90 tablet, Rfl: 2 ?  doxycycline (ORACEA) 40 MG capsule, Take 40 mg by mouth daily., Disp: , Rfl:  ?  fluticasone (FLONASE) 50 MCG/ACT nasal spray, Place 1 spray into both nostrils daily., Disp: 48 g, Rfl: 1 ?  lithium carbonate (LITHOBID) 300 MG CR tablet, Take 300 mg by mouth daily., Disp: , Rfl:  ?  mirtazapine (REMERON) 7.5 MG tablet, Take 1 tablet (7.5 mg total) by mouth at bedtime., Disp: 90 tablet, Rfl: 0 ?  omega-3 acid ethyl esters (LOVAZA) 1 g capsule, TAKE ONE CAPSULE BY MOUTH THREE TIMES A DAY, Disp: 270 capsule, Rfl: 3 ?  omeprazole (PRILOSEC) 40 MG capsule, Take 1 capsule (40 mg total) by mouth daily. Take one by mouth twice a day (Patient taking differently: Take 40 mg by mouth daily.), Disp: 90 capsule, Rfl: 1 ?  Probiotic Product (PROBIOTIC PO), Take by  mouth., Disp: , Rfl:  ?  RHOFADE 1 % CREA, Apply 1 application topically every morning., Disp: , Rfl:  ?  sertraline (ZOLOFT) 50 MG tablet, Take 1 tablet (50 mg total) by mouth daily., Disp: 90 tablet, Rfl: 1 ?  tadalafil (CIALIS) 20 MG tablet, Take 1 tablet (20 mg total) by mouth daily as needed., Disp: 10 tablet, Rfl: 2 ?  triamcinolone cream (KENALOG) 0.1 %, Apply topically., Disp: , Rfl:  ? ?Current Facility-Administered Medications:  ?  0.9 %  sodium chloride infusion, 500 mL, Intravenous, Once, Danis, Kirke Corin, MD ? ? ?Vital signs in last 24 hrs: ?Vitals:  ? 05/23/21 1402  ?BP: (!) 142/66  ?Pulse: 70  ? ?Wt Readings from Last 3 Encounters:  ?05/23/21 193 lb (87.5 kg)  ?01/17/21 187 lb (84.8 kg)  ?01/03/21 187 lb (84.8 kg)  ?  ?Physical Exam ?Looks well.  Normal vocal quality. ?No additional exam, entire visit spent in review and discussion. ? ?Labs: ? ? ?___________________________________________ ?Radiologic studies: ? ? ?____________________________________________ ?Other: ? ? ?_____________________________________________ ?Assessment & Plan  ?Assessment: ?Encounter Diagnosis  ?Name Primary?  ? Gastroesophageal reflux disease with esophagitis without hemorrhage Yes  ? ?He had chronic severe symptoms that brought him to me last year, severe reflux esophagitis and stricture underwent balloon dilation, significant improvement on twice daily PPI.  He had complete resolution of symptoms and endoscopic findings on follow-up EGD. ?He clearly  has severe underlying reflux and would prefer not to be on medicines long-term if possible. ? ?He will decrease the omeprazole to 40 mg every other day but not go lower than that or I think he would likely have recurrence of symptoms.  If he has recurrence of heartburn with that medicine wean, he will contact me and I will have him go back up to once daily. ?If he remains under good control, he would like to consider more definitive antireflux options.  I showed him diagrams  of the anatomy, discussed the nature of GERD and possibilities of surgical or TIF fundoplication for durable control of reflux. ? ?Richardson Landry was intrigued by the possibility of a TIF, and I think he would probably be a good candidate given the small hiatal hernia, severity of his reflux symptoms before he went on meds and his overall good health. ?Plan: ?He was given some additional information about TIF, would like to research and consider it further, and if he contacts [place] with desire to move forward, we will set him up with an appointment to see Dr. Gerrit Heck. ? ?20 minutes were spent on this encounter (including chart review, history/exam, counseling/coordination of care, and documentation) > 50% of that time was spent on counseling and coordination of care.  ? ?Nelida Meuse III ? ?

## 2021-05-23 NOTE — Patient Instructions (Signed)
If you are age 58 or older, your body mass index should be between 23-30. Your Body mass index is 27.69 kg/m?Marland Kitchen If this is out of the aforementioned range listed, please consider follow up with your Primary Care Provider. ? ?If you are age 40 or younger, your body mass index should be between 19-25. Your Body mass index is 27.69 kg/m?Marland Kitchen If this is out of the aformentioned range listed, please consider follow up with your Primary Care Provider.  ? ?________________________________________________________ ? ?The [facility] providers would like to encourage you to use [facility] to communicate with providers for non-urgent requests or questions.  Due to long hold times on the telephone, sending your provider a message by [facility] may be a faster and more efficient way to get a response.  Please allow 48 business hours for a response.  Please remember that this is for non-urgent requests.  ?_______________________________________________________ ? ?It was a pleasure to see you today! ? ?Thank you for trusting me with your gastrointestinal care!   ? ? ? ?

## 2021-06-30 DIAGNOSIS — Z85828 Personal history of other malignant neoplasm of skin: Secondary | ICD-10-CM | POA: Insufficient documentation

## 2021-07-28 ENCOUNTER — Other Ambulatory Visit: Payer: Self-pay | Admitting: Adult Health

## 2021-07-28 ENCOUNTER — Encounter: Payer: Self-pay | Admitting: Adult Health

## 2021-07-29 NOTE — Telephone Encounter (Signed)
Rx refilled.

## 2021-08-02 ENCOUNTER — Other Ambulatory Visit: Payer: Self-pay | Admitting: Gastroenterology

## 2021-09-26 ENCOUNTER — Encounter: Payer: Self-pay | Admitting: Adult Health

## 2021-09-30 ENCOUNTER — Other Ambulatory Visit: Payer: Self-pay

## 2021-09-30 ENCOUNTER — Telehealth: Payer: Self-pay | Admitting: Adult Health

## 2021-09-30 DIAGNOSIS — G4700 Insomnia, unspecified: Secondary | ICD-10-CM

## 2021-09-30 DIAGNOSIS — F4322 Adjustment disorder with anxiety: Secondary | ICD-10-CM

## 2021-09-30 MED ORDER — MIRTAZAPINE 7.5 MG PO TABS: 7.5000 mg | ORAL_TABLET | Freq: Every day | ORAL | 0 refills | Status: DC

## 2021-09-30 NOTE — Telephone Encounter (Signed)
Spoke to pt and advised that the lithium was not prescribed by [facility].Advised pt to follow up with who it was prescribed for and pt stated that provider retired. Please advise

## 2021-09-30 NOTE — Telephone Encounter (Signed)
Pt is very concerned that he has not received his refills for the following:  mirtazapine 7.'5mg'$  lithium '300mg'$   Pt is asking that we please call [facility] on [address] in [place].  Thank you.

## 2021-09-30 NOTE — Telephone Encounter (Signed)
Patient notified of update  and verbalized understanding. 

## 2021-10-01 NOTE — Telephone Encounter (Signed)
Pt notified that lithium would need to come from psychiatry. Pt declined and stated he did not want the medication he was just concerned if he could stop " cold [place]". Pt advised per Tommi Rumps that he did not know much about lithium. Pt advised to contact the office where he received the Rx to direct any questions or concerns regarding the medication. Pt verbalized understanding.

## 2021-10-22 ENCOUNTER — Encounter: Payer: Self-pay | Admitting: Adult Health

## 2021-10-22 ENCOUNTER — Ambulatory Visit (INDEPENDENT_AMBULATORY_CARE_PROVIDER_SITE_OTHER): Payer: 59 | Admitting: Adult Health

## 2021-10-22 VITALS — BP 132/82 | HR 78 | Temp 98.8°F | Ht 70.0 in | Wt 189.0 lb

## 2021-10-22 DIAGNOSIS — Z125 Encounter for screening for malignant neoplasm of prostate: Secondary | ICD-10-CM

## 2021-10-22 DIAGNOSIS — I10 Essential (primary) hypertension: Secondary | ICD-10-CM

## 2021-10-22 DIAGNOSIS — G4700 Insomnia, unspecified: Secondary | ICD-10-CM

## 2021-10-22 DIAGNOSIS — Z0000 Encounter for general adult medical examination without abnormal findings: Secondary | ICD-10-CM

## 2021-10-22 DIAGNOSIS — G4733 Obstructive sleep apnea (adult) (pediatric): Secondary | ICD-10-CM | POA: Diagnosis not present

## 2021-10-22 DIAGNOSIS — E782 Mixed hyperlipidemia: Secondary | ICD-10-CM | POA: Diagnosis not present

## 2021-10-22 DIAGNOSIS — F4322 Adjustment disorder with anxiety: Secondary | ICD-10-CM

## 2021-10-22 LAB — CBC WITH DIFFERENTIAL/PLATELET
Basophils Absolute: 0 10*3/uL (ref 0.0–0.1)
Basophils Relative: 1.1 % (ref 0.0–3.0)
Eosinophils Absolute: 0.1 10*3/uL (ref 0.0–0.7)
Eosinophils Relative: 3.4 % (ref 0.0–5.0)
HCT: 49 % (ref 39.0–52.0)
Hemoglobin: 16.6 g/dL (ref 13.0–17.0)
Lymphocytes Relative: 23.2 % (ref 12.0–46.0)
Lymphs Abs: 0.6 10*3/uL — ABNORMAL LOW (ref 0.7–4.0)
MCHC: 33.9 g/dL (ref 30.0–36.0)
MCV: 98.2 fl (ref 78.0–100.0)
Monocytes Absolute: 0.4 10*3/uL (ref 0.1–1.0)
Monocytes Relative: 13.6 % — ABNORMAL HIGH (ref 3.0–12.0)
Neutro Abs: 1.6 10*3/uL (ref 1.4–7.7)
Neutrophils Relative %: 58.7 % (ref 43.0–77.0)
Platelets: 105 10*3/uL — ABNORMAL LOW (ref 150.0–400.0)
RBC: 4.99 Mil/uL (ref 4.22–5.81)
RDW: 13.1 % (ref 11.5–15.5)
WBC: 2.8 10*3/uL — ABNORMAL LOW (ref 4.0–10.5)

## 2021-10-22 LAB — LIPID PANEL
Cholesterol: 181 mg/dL (ref 0–200)
HDL: 79 mg/dL (ref >39.00)
LDL Cholesterol: 85 mg/dL (ref 0–99)
NonHDL: 101.57
Total CHOL/HDL Ratio: 2
Triglycerides: 81 mg/dL (ref 0.0–149.0)
VLDL: 16.2 mg/dL (ref 0.0–40.0)

## 2021-10-22 LAB — COMPREHENSIVE METABOLIC PANEL
ALT: 71 U/L — ABNORMAL HIGH (ref 0–53)
AST: 65 U/L — ABNORMAL HIGH (ref 0–37)
Albumin: 5 g/dL (ref 3.5–5.2)
Alkaline Phosphatase: 77 U/L (ref 39–117)
BUN: 12 mg/dL (ref 6–23)
CO2: 25 mEq/L (ref 19–32)
Calcium: 9.6 mg/dL (ref 8.4–10.5)
Chloride: 102 mEq/L (ref 96–112)
Creatinine, Ser: 1.01 mg/dL (ref 0.40–1.50)
GFR: 82.21 mL/min (ref >60.00)
Glucose, Bld: 99 mg/dL (ref 70–99)
Potassium: 3.8 mEq/L (ref 3.5–5.1)
Sodium: 139 mEq/L (ref 135–145)
Total Bilirubin: 0.9 mg/dL (ref 0.2–1.2)
Total Protein: 7.3 g/dL (ref 6.0–8.3)

## 2021-10-22 LAB — TSH: TSH: 2.13 u[IU]/mL (ref 0.35–5.50)

## 2021-10-22 LAB — PSA: PSA: 0.53 ng/mL (ref 0.10–4.00)

## 2021-10-22 NOTE — Progress Notes (Signed)
Subjective:    Patient ID: Raymond Johnson, male    DOB: 10-19-1965, 56 y.o.   MRN: [id number]  HPI Patient presents for yearly preventative medicine examination. He is a pleasant 56 year old male who  has a past medical history of Allergy, Anxiety, Chronic headaches, Diverticulitis, ED (erectile dysfunction), Elevated liver enzymes, Fatty liver, GERD (gastroesophageal reflux disease), Gout, History of colonic polyps, History of kidney stones, HLD (hyperlipidemia), Hypertension, Seasonal allergies, and Sleep apnea.  Hypertension-managed with Lotrel 5-20 mg daily.  He denies dizziness, lightheadedness, chest pain, or shortness of breath BP Readings from Last 3 Encounters:  10/22/21 132/82  05/23/21 (!) 142/66  01/17/21 (!) 96/57   Anxiety-prescribed Zoloft 50 mg.  He does feel as though his symptoms are controlled.  This is managed by psychiatry  Insomnia-takes Remeron 7.5 mg daily.  He does sleep well on this dose.  Denies side effects such as groggy feeling when he wakes up, increased appetite, or weight gain  Hyperlipidemia-managed with Lipitor 10 mg daily and Lovaza 1 g 3 times daily. He is interested in Calcium Scoring CT   Lab Results  Component Value Date   CHOL 181 10/16/2020   HDL 87.20 10/16/2020   LDLCALC 82 10/16/2020   TRIG 59.0 10/16/2020   CHOLHDL 2 10/16/2020   ED - prescribed cialis 20 mg PRN   OSA -does not wear his CPAP reports that it was too much of a hassle to wear. He feels as though he gets good sleep and does not wake up feeling fatigued   GERD -managed with Prilosec 40 mg daily and his symptoms are controlled.  His last endoscopy and 07/2020 showed LA grade D reflux esophagitis with no bleeding.  All immunizations and health maintenance protocols were reviewed with the patient and needed orders were placed.  Appropriate screening laboratory values were ordered for the patient including screening of hyperlipidemia, renal function and hepatic  function. If indicated by BPH, a PSA was ordered.  Medication reconciliation,  past medical history, social history, problem list and allergies were reviewed in detail with the patient  Goals were established with regard to weight loss, exercise, and  diet in compliance with medications. He stays active and tries to eat healthy   Wt Readings from Last 3 Encounters:  10/22/21 189 lb (85.7 kg)  05/23/21 193 lb (87.5 kg)  01/17/21 187 lb (84.8 kg)   Review of Systems  Constitutional: Negative.   HENT: Negative.    Eyes: Negative.   Respiratory: Negative.    Cardiovascular: Negative.   Gastrointestinal: Negative.   Endocrine: Negative.   Genitourinary: Negative.   Musculoskeletal:  Positive for arthralgias.  Skin: Negative.   Allergic/Immunologic: Negative.   Neurological: Negative.   Hematological: Negative.   Psychiatric/Behavioral: Negative.    All other systems reviewed and are negative.  Past Medical History:  Diagnosis Date   Allergy    Anxiety    Chronic headaches    stress headaches   Diverticulitis    ED (erectile dysfunction)    Elevated liver enzymes    history being elevated   Fatty liver    GERD (gastroesophageal reflux disease)    Gout    >10 years from last episode   History of colonic polyps    History of kidney stones    HLD (hyperlipidemia)    Hypertension    Seasonal allergies    Sleep apnea    not using CPAP    Social History   Socioeconomic History  Marital status: Single    Spouse name: Not on file   Number of children: 3   Years of education: Not on file   Highest education level: Not on file  Occupational History   Occupation: Sales  Tobacco Use   Smoking status: Former    Packs/day: 0.25    Years: 31.00    Total pack years: 7.75    Types: Cigarettes    Start date: 28    Quit date: 2014    Years since quitting: 9.5   Smokeless tobacco: Never   Tobacco comments:    quit from age 7 for 4 years   Vaping Use   Vaping Use:  Never used  Substance and Sexual Activity   Alcohol use: Yes    Comment: "socially"   Drug use: No   Sexual activity: Not on file  Other Topics Concern   Not on file  Social History Narrative   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, drilling      Not married - Divorced      Three children - 2 live locally, one in [place] Strain: Not on file  Food Insecurity: Not on file  Transportation Needs: Not on file  Physical Activity: Not on file  Stress: Not on file  Social Connections: Not on file  Intimate Partner Violence: Not on file    Past Surgical History:  Procedure Laterality Date   COLONOSCOPY  2016   [place] LITHOTRIPSY Left 01/28/2017   Procedure: LEFT EXTRACORPOREAL SHOCK WAVE LITHOTRIPSY (ESWL);  Surgeon: Irine Seal, MD;  Location: [facility];  Service: Urology;  Laterality: Left;   HERNIA REPAIR     at age 5 years   POLYPECTOMY     skin cancer      not cancer- benign   UPPER GASTROINTESTINAL ENDOSCOPY      Family History  Problem Relation Age of Onset   Breast cancer Mother    Lung cancer Father    Prostate cancer Brother 51   Cancer Paternal Grandfather        ? colon    Colon cancer Neg Hx    Esophageal cancer Neg Hx    Rectal cancer Neg Hx    Stomach cancer Neg Hx    Colon polyps Neg Hx     No Known Allergies  Current Outpatient Medications on File Prior to Visit  Medication Sig Dispense Refill   amLODipine-benazepril (LOTREL) 5-20 MG capsule Take 1 capsule by mouth daily. 90 capsule 2   atorvastatin (LIPITOR) 10 MG tablet Take 1 tablet (10 mg total) by mouth daily. 90 tablet 2   doxycycline (ORACEA) 40 MG capsule Take 40 mg by mouth daily.     fluticasone (FLONASE) 50 MCG/ACT nasal spray Place 1 spray into both nostrils daily. 48 g 1   mirtazapine (REMERON) 7.5 MG tablet Take 1 tablet (7.5 mg total) by mouth at bedtime. 90  tablet 0   omega-3 acid ethyl esters (LOVAZA) 1 g capsule TAKE ONE CAPSULE BY MOUTH THREE TIMES A DAY 270 capsule 3   omeprazole (PRILOSEC) 40 MG capsule Take 1 capsule (40 mg total) by mouth daily. 90 capsule 1   Probiotic Product (PROBIOTIC PO) Take by mouth.     RHOFADE 1 % CREA Apply 1 application topically every morning.     sertraline (ZOLOFT) 50  MG tablet Take 1 tablet (50 mg total) by mouth daily. 90 tablet 1   tadalafil (CIALIS) 20 MG tablet TAKE ONE TABLET BY MOUTH DAILY AS NEEDED 10 tablet 2   triamcinolone cream (KENALOG) 0.1 % Apply topically.     Current Facility-Administered Medications on File Prior to Visit  Medication Dose Route Frequency Provider Last Rate Last Admin   0.9 %  sodium chloride infusion  500 mL Intravenous Once Danis, Estill Cotta III, MD        BP 132/82   Pulse 78   Temp 98.8 F (37.1 C) (Oral)   Ht '5\' 10"'$  (1.778 m)   Wt 189 lb (85.7 kg)   SpO2 99%   BMI 27.12 kg/m        Objective:   Physical Exam Vitals and nursing note reviewed.  Constitutional:      General: He is not in acute distress.    Appearance: Normal appearance. He is well-developed and normal weight.  HENT:     Head: Normocephalic and atraumatic.     Right Ear: Tympanic membrane, ear canal and external ear normal. There is no impacted cerumen.     Left Ear: Tympanic membrane, ear canal and external ear normal. There is no impacted cerumen.     Nose: Nose normal. No congestion or rhinorrhea.     Mouth/Throat:     Mouth: Mucous membranes are moist.     Pharynx: Oropharynx is clear. No oropharyngeal exudate or posterior oropharyngeal erythema.  Eyes:     General:        Right eye: No discharge.        Left eye: No discharge.     Extraocular Movements: Extraocular movements intact.     Conjunctiva/sclera: Conjunctivae normal.     Pupils: Pupils are equal, round, and reactive to light.  Neck:     Vascular: No carotid bruit.     Trachea: No tracheal deviation.  Cardiovascular:      Rate and Rhythm: Normal rate and regular rhythm.     Pulses: Normal pulses.     Heart sounds: Normal heart sounds. No murmur heard.    No friction rub. No gallop.  Pulmonary:     Effort: Pulmonary effort is normal. No respiratory distress.     Breath sounds: Normal breath sounds. No stridor. No wheezing, rhonchi or rales.  Chest:     Chest wall: No tenderness.  Abdominal:     General: Bowel sounds are normal. There is no distension.     Palpations: Abdomen is soft. There is no mass.     Tenderness: There is no abdominal tenderness. There is no right CVA tenderness, left CVA tenderness, guarding or rebound.     Hernia: No hernia is present.  Musculoskeletal:        General: No swelling, tenderness, deformity or signs of injury. Normal range of motion.     Right lower leg: No edema.     Left lower leg: No edema.  Lymphadenopathy:     Cervical: No cervical adenopathy.  Skin:    General: Skin is warm and dry.     Capillary Refill: Capillary refill takes less than 2 seconds.     Coloration: Skin is not jaundiced or pale.     Findings: No bruising, erythema, lesion or rash.  Neurological:     General: No focal deficit present.     Mental Status: He is alert and oriented to person, place, and time.     Cranial  Nerves: No cranial nerve deficit.     Sensory: No sensory deficit.     Motor: No weakness.     Coordination: Coordination normal.     Gait: Gait normal.     Deep Tendon Reflexes: Reflexes normal.  Psychiatric:        Mood and Affect: Mood normal.        Behavior: Behavior normal.        Thought Content: Thought content normal.        Judgment: Judgment normal.       Assessment & Plan:  1. Routine general medical examination at a health care facility - Continue to stay active and eat healthy  - Follow up in one year or sooner if needed - CBC with Differential/Platelet; Future - Comprehensive metabolic panel; Future - Lipid panel; Future - TSH; Future - TSH - Lipid  panel - Comprehensive metabolic panel - CBC with Differential/Platelet  2. Prostate cancer screening  - PSA; Future - PSA  3. Essential hypertension - Well controlled. No change in medications  - CBC with Differential/Platelet; Future - Comprehensive metabolic panel; Future - Lipid panel; Future - TSH; Future - TSH - Lipid panel - Comprehensive metabolic panel - CBC with Differential/Platelet  4. Mixed hyperlipidemia - Consider increase in statin  - CBC with Differential/Platelet; Future - Comprehensive metabolic panel; Future - Lipid panel; Future - TSH; Future - CT CARDIAC SCORING (SELF PAY ONLY); Future - TSH - Lipid panel - Comprehensive metabolic panel - CBC with Differential/Platelet  5. OSA (obstructive sleep apnea)  - CBC with Differential/Platelet; Future - Comprehensive metabolic panel; Future - Lipid panel; Future - TSH; Future - TSH - Lipid panel - Comprehensive metabolic panel - CBC with Differential/Platelet  6. Insomnia, unspecified type - Continue with remeron  - CBC with Differential/Platelet; Future - Comprehensive metabolic panel; Future - Lipid panel; Future - TSH; Future - TSH - Lipid panel - Comprehensive metabolic panel - CBC with Differential/Platelet  7. Adjustment disorder with anxious mood - Continue with Zoloft and remeron - CBC with Differential/Platelet; Future - Comprehensive metabolic panel; Future - Lipid panel; Future - TSH; Future - TSH - Lipid panel - Comprehensive metabolic panel - CBC with Differential/Platelet  [name], NP

## 2021-10-23 ENCOUNTER — Telehealth: Payer: Self-pay | Admitting: Adult Health

## 2021-10-23 DIAGNOSIS — R748 Abnormal levels of other serum enzymes: Secondary | ICD-10-CM

## 2021-10-23 DIAGNOSIS — D696 Thrombocytopenia, unspecified: Secondary | ICD-10-CM

## 2021-10-23 DIAGNOSIS — D709 Neutropenia, unspecified: Secondary | ICD-10-CM

## 2021-10-23 NOTE — Addendum Note (Signed)
Addended by: Apolinar Junes on: 10/23/2021 12:47 PM   Modules accepted: Level of Service

## 2021-10-23 NOTE — Telephone Encounter (Signed)
Spoke to patient and informed him of his labs.  His liver enzymes are slightly elevated past baseline.  His CBC also showed a WBC of 2.3 and platelets of 105.  We will have him come back and repeat labs also check for autoimmune and rheumatoid arthritis.

## 2021-11-02 ENCOUNTER — Encounter: Payer: Self-pay | Admitting: Adult Health

## 2021-11-02 ENCOUNTER — Other Ambulatory Visit: Payer: Self-pay | Admitting: Adult Health

## 2021-11-03 ENCOUNTER — Ambulatory Visit (HOSPITAL_COMMUNITY)
Admission: RE | Admit: 2021-11-03 | Discharge: 2021-11-03 | Disposition: A | Discharge status: Home / Self Care | Payer: 59 | Source: Ambulatory Visit | Attending: Adult Health | Admitting: Adult Health

## 2021-11-03 ENCOUNTER — Encounter (HOSPITAL_COMMUNITY): Payer: Self-pay

## 2021-11-03 DIAGNOSIS — E782 Mixed hyperlipidemia: Secondary | ICD-10-CM | POA: Insufficient documentation

## 2021-11-19 ENCOUNTER — Ambulatory Visit: Payer: 59 | Admitting: Family Medicine

## 2021-11-20 ENCOUNTER — Ambulatory Visit: Payer: 59 | Admitting: Adult Health

## 2021-11-21 ENCOUNTER — Encounter: Payer: Self-pay | Admitting: Adult Health

## 2021-11-21 ENCOUNTER — Ambulatory Visit: Payer: 59 | Admitting: Adult Health

## 2021-11-21 VITALS — BP 120/70 | HR 70 | Temp 98.8°F | Ht 70.0 in | Wt 193.0 lb

## 2021-11-21 DIAGNOSIS — T148XXA Other injury of unspecified body region, initial encounter: Secondary | ICD-10-CM

## 2021-11-21 MED ORDER — METHYLPREDNISOLONE 4 MG PO TBPK: ORAL_TABLET | ORAL | 0 refills | Status: DC

## 2021-11-21 MED ORDER — CYCLOBENZAPRINE HCL 10 MG PO TABS: 10.0000 mg | ORAL_TABLET | Freq: Three times a day (TID) | ORAL | 0 refills | Status: DC | PRN

## 2021-11-21 NOTE — Progress Notes (Signed)
Subjective:    Patient ID: Raymond Johnson, male    DOB: [birth year], 56 y.o.   MRN: [id number]  HPI  56 year old male who  has a past medical history of Allergy, Anxiety, Chronic headaches, Diverticulitis, ED (erectile dysfunction), Elevated liver enzymes, Fatty liver, GERD (gastroesophageal reflux disease), Gout, History of colonic polyps, History of kidney stones, HLD (hyperlipidemia), Hypertension, Seasonal allergies, and Sleep apnea.   He presents to the office today for an ongoing issue of right sided upper back pain.  Reports that this has been going on intermittently over the last few months but over the last week things he has done in the past have not helped resolve the pain.  Reports pain with certain movements such as reaching over his head and sleeping.  Pain will wake him up in the middle the night.  Pain is worse in the morning but then improves in the afternoon but does not resolve.  He does have some radiating pain that goes down his right arm, pain is felt as an ache.  He denies any trauma or aggravating injury.  In the past hot Jacuzzi, anti-inflammatories, and warm showers have helped.  Over the last week this has not helped at all.  Review of Systems See HPI   Past Medical History:  Diagnosis Date   Allergy    Anxiety    Chronic headaches    stress headaches   Diverticulitis    ED (erectile dysfunction)    Elevated liver enzymes    history being elevated   Fatty liver    GERD (gastroesophageal reflux disease)    Gout    >10 years from last episode   History of colonic polyps    History of kidney stones    HLD (hyperlipidemia)    Hypertension    Seasonal allergies    Sleep apnea    not using CPAP    Social History   Socioeconomic History   Marital status: Single    Spouse name: Not on file   Number of children: 3   Years of education: Not on file   Highest education level: Not on file  Occupational History   Occupation: Sales  Tobacco Use    Smoking status: Former    Packs/day: 0.25    Years: 31.00    Total pack years: 7.75    Types: Cigarettes    Start date: 9    Quit date: 2014    Years since quitting: 9.6   Smokeless tobacco: Never   Tobacco comments:    quit from age 70 for 4 years   Vaping Use   Vaping Use: Never used  Substance and Sexual Activity   Alcohol use: Yes    Comment: "socially"   Drug use: No   Sexual activity: Not on file  Other Topics Concern   Not on file  Social History Narrative   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, drilling      Not married - Divorced      Three children - 2 live locally, one in [place] Determinants of Health   Financial Resource Strain: Not on file  Food Insecurity: Not on file  Transportation Needs: Not on file  Physical Activity: Not on file  Stress: Not on file  Social Connections: Not on file  Intimate Partner Violence: Not on file    Past Surgical History:  Procedure Laterality Date   COLONOSCOPY  2016  [facility]    COLONOSCOPY     EXTRACORPOREAL SHOCK WAVE LITHOTRIPSY Left 01/28/2017   Procedure: LEFT EXTRACORPOREAL SHOCK WAVE LITHOTRIPSY (ESWL);  Surgeon: Irine Seal, MD;  Location: [facility];  Service: Urology;  Laterality: Left;   HERNIA REPAIR     at age 53 years   POLYPECTOMY     skin cancer      not cancer- benign   UPPER GASTROINTESTINAL ENDOSCOPY      Family History  Problem Relation Age of Onset   Breast cancer Mother    Lung cancer Father    Prostate cancer Brother 19   Cancer Paternal Grandfather        ? colon    Colon cancer Neg Hx    Esophageal cancer Neg Hx    Rectal cancer Neg Hx    Stomach cancer Neg Hx    Colon polyps Neg Hx     No Known Allergies  Current Outpatient Medications on File Prior to Visit  Medication Sig Dispense Refill   amLODipine-benazepril (LOTREL) 5-20 MG capsule Take 1 capsule by mouth daily. 90 capsule 2   atorvastatin (LIPITOR) 10 MG tablet TAKE ONE TABLET BY  MOUTH DAILY 90 tablet 0   doxycycline (ORACEA) 40 MG capsule Take 40 mg by mouth daily.     fluticasone (FLONASE) 50 MCG/ACT nasal spray Place 1 spray into both nostrils daily. 48 g 1   mirtazapine (REMERON) 7.5 MG tablet Take 1 tablet (7.5 mg total) by mouth at bedtime. 90 tablet 0   omega-3 acid ethyl esters (LOVAZA) 1 g capsule TAKE ONE CAPSULE BY MOUTH THREE TIMES A DAY 270 capsule 3   omeprazole (PRILOSEC) 40 MG capsule Take 1 capsule (40 mg total) by mouth daily. 90 capsule 1   Probiotic Product (PROBIOTIC PO) Take by mouth.     RHOFADE 1 % CREA Apply 1 application topically every morning.     sertraline (ZOLOFT) 50 MG tablet Take 1 tablet (50 mg total) by mouth daily. 90 tablet 1   tadalafil (CIALIS) 20 MG tablet TAKE ONE TABLET BY MOUTH DAILY AS NEEDED 10 tablet 2   triamcinolone cream (KENALOG) 0.1 % Apply topically.     Current Facility-Administered Medications on File Prior to Visit  Medication Dose Route Frequency Provider Last Rate Last Admin   0.9 %  sodium chloride infusion  500 mL Intravenous Once Danis, Estill Cotta III, MD        BP 120/70   Pulse 70   Temp 98.8 F (37.1 C) (Oral)   Ht '5\' 10"'$  (1.778 m)   Wt 193 lb (87.5 kg)   SpO2 98%   BMI 27.69 kg/m       Objective:   Physical Exam Vitals and nursing note reviewed.  Constitutional:      Appearance: Normal appearance.  Cardiovascular:     Rate and Rhythm: Normal rate and regular rhythm.     Pulses: Normal pulses.     Heart sounds: Normal heart sounds.  Pulmonary:     Effort: Pulmonary effort is normal.     Breath sounds: Normal breath sounds.  Musculoskeletal:        General: Tenderness present. Normal range of motion.       Arms:  Neurological:     General: No focal deficit present.     Mental Status: He is alert and oriented to person, place, and time.  Psychiatric:        Mood and Affect: Mood normal.  Behavior: Behavior normal.        Thought Content: Thought content normal.        Assessment & Plan:  1. Muscle strain  - cyclobenzaprine (FLEXERIL) 10 MG tablet; Take 1 tablet (10 mg total) by mouth 3 (three) times daily as needed for muscle spasms.  Dispense: 30 tablet; Refill: 0 - methylPREDNISolone (MEDROL DOSEPAK) 4 MG TBPK tablet; Take as directed  Dispense: 21 tablet; Refill: 0 - Continue with NSAIDS, warm compresses - Follow up via mychart if not improved   [name], NP

## 2021-12-05 ENCOUNTER — Encounter: Payer: Self-pay | Admitting: Adult Health

## 2021-12-05 ENCOUNTER — Other Ambulatory Visit: Payer: Self-pay | Admitting: Adult Health

## 2021-12-05 DIAGNOSIS — M545 Low back pain, unspecified: Secondary | ICD-10-CM

## 2021-12-05 MED ORDER — TRAMADOL HCL 50 MG PO TABS: 50.0000 mg | ORAL_TABLET | Freq: Three times a day (TID) | ORAL | 0 refills | Status: AC | PRN

## 2021-12-05 MED ORDER — CYCLOBENZAPRINE HCL 10 MG PO TABS: 10.0000 mg | ORAL_TABLET | Freq: Three times a day (TID) | ORAL | 0 refills | Status: DC | PRN

## 2021-12-05 NOTE — Telephone Encounter (Signed)
Pt called to ask if NP could possible increase the dosage of the cyclobenzaprine (FLEXERIL) 10 MG tablet  Please advise.

## 2021-12-05 NOTE — Telephone Encounter (Signed)
Please advise 

## 2021-12-08 DIAGNOSIS — M5412 Radiculopathy, cervical region: Secondary | ICD-10-CM | POA: Insufficient documentation

## 2021-12-22 ENCOUNTER — Other Ambulatory Visit: Payer: Self-pay | Admitting: Adult Health

## 2021-12-22 DIAGNOSIS — I10 Essential (primary) hypertension: Secondary | ICD-10-CM

## 2022-01-09 ENCOUNTER — Encounter: Payer: Self-pay | Admitting: Adult Health

## 2022-01-09 ENCOUNTER — Other Ambulatory Visit: Payer: Self-pay | Admitting: Adult Health

## 2022-01-09 DIAGNOSIS — G4700 Insomnia, unspecified: Secondary | ICD-10-CM

## 2022-01-09 DIAGNOSIS — F4322 Adjustment disorder with anxiety: Secondary | ICD-10-CM

## 2022-01-09 MED ORDER — MIRTAZAPINE 7.5 MG PO TABS: 7.5000 mg | ORAL_TABLET | Freq: Every day | ORAL | 1 refills | Status: DC

## 2022-02-01 ENCOUNTER — Other Ambulatory Visit: Payer: Self-pay | Admitting: Adult Health

## 2022-02-01 ENCOUNTER — Encounter: Payer: Self-pay | Admitting: Adult Health

## 2022-02-25 ENCOUNTER — Telehealth: Payer: Self-pay | Admitting: Pulmonary Disease

## 2022-02-25 ENCOUNTER — Telehealth: Payer: Self-pay | Admitting: *Deleted

## 2022-02-25 NOTE — Telephone Encounter (Signed)
Patient presented to the office requesting a copy of his sleep study from 2020.  Study printed, verified patient' dob and provided patient with copy of sleep study.  Nothing further needed.

## 2022-02-26 ENCOUNTER — Encounter: Payer: Self-pay | Admitting: Adult Health

## 2022-02-26 ENCOUNTER — Other Ambulatory Visit: Payer: Self-pay | Admitting: Adult Health

## 2022-02-27 ENCOUNTER — Other Ambulatory Visit: Payer: Self-pay | Admitting: Adult Health

## 2022-02-27 MED ORDER — TADALAFIL 20 MG PO TABS: 20.0000 mg | ORAL_TABLET | Freq: Every day | ORAL | 6 refills | Status: DC | PRN

## 2022-03-09 ENCOUNTER — Other Ambulatory Visit: Payer: Self-pay | Admitting: Adult Health

## 2022-03-09 DIAGNOSIS — E782 Mixed hyperlipidemia: Secondary | ICD-10-CM

## 2022-03-11 ENCOUNTER — Encounter: Payer: Self-pay | Admitting: Adult Health

## 2022-04-15 ENCOUNTER — Telehealth (INDEPENDENT_AMBULATORY_CARE_PROVIDER_SITE_OTHER): Payer: 59 | Admitting: Adult Health

## 2022-04-15 ENCOUNTER — Encounter: Payer: Self-pay | Admitting: Adult Health

## 2022-04-15 VITALS — Ht 70.0 in | Wt 193.0 lb

## 2022-04-15 DIAGNOSIS — F4322 Adjustment disorder with anxiety: Secondary | ICD-10-CM | POA: Diagnosis not present

## 2022-04-15 MED ORDER — SERTRALINE HCL 100 MG PO TABS: 100.0000 mg | ORAL_TABLET | Freq: Every day | ORAL | 1 refills | Status: DC

## 2022-04-15 NOTE — Progress Notes (Signed)
Virtual Visit via Video Note  I connected with Raymond Johnson  on 04/15/22 at  9:30 AM EST by a video enabled telemedicine application and verified that I am speaking with the correct person using two identifiers.  Location patient: home Location provider:work or home office Persons participating in the virtual visit: patient, provider  I discussed the limitations of evaluation and management by telemedicine and the availability of in person appointments. The patient expressed understanding and agreed to proceed.   HPI: He is being evaluated today for a chronic issue of anxiety.  He is currently prescribed Zoloft 50 g daily and takes Remeron 7.5 mg at night.  He reports that as of lately he has been feeling a little bit more anxious and on edge.  He is interested in some medication adjustments to help him feel  better.   ROS: See pertinent positives and negatives per HPI.  Past Medical History:  Diagnosis Date   Allergy    Anxiety    Chronic headaches    stress headaches   Diverticulitis    ED (erectile dysfunction)    Elevated liver enzymes    history being elevated   Fatty liver    GERD (gastroesophageal reflux disease)    Gout    >10 years from last episode   History of colonic polyps    History of kidney stones    HLD (hyperlipidemia)    Hypertension    Seasonal allergies    Sleep apnea    not using CPAP    Past Surgical History:  Procedure Laterality Date   COLONOSCOPY  2016   [facility]    COLONOSCOPY     EXTRACORPOREAL SHOCK WAVE LITHOTRIPSY Left 01/28/2017   Procedure: LEFT EXTRACORPOREAL SHOCK WAVE LITHOTRIPSY (ESWL);  Surgeon: Irine Seal, MD;  Location: [facility];  Service: Urology;  Laterality: Left;   HERNIA REPAIR     at age 57 years   POLYPECTOMY     skin cancer      not cancer- benign   UPPER GASTROINTESTINAL ENDOSCOPY      Family History  Problem Relation Age of Onset   Breast cancer Mother    Lung cancer Father    Prostate cancer  Brother 88   Cancer Paternal Grandfather        ? colon    Colon cancer Neg Hx    Esophageal cancer Neg Hx    Rectal cancer Neg Hx    Stomach cancer Neg Hx    Colon polyps Neg Hx        Current Outpatient Medications:    amLODipine-benazepril (LOTREL) 5-20 MG capsule, TAKE ONE CAPSULE BY MOUTH DAILY, Disp: 90 capsule, Rfl: 1   atorvastatin (LIPITOR) 10 MG tablet, TAKE ONE TABLET BY MOUTH DAILY, Disp: 90 tablet, Rfl: 0   cyclobenzaprine (FLEXERIL) 10 MG tablet, Take 1 tablet (10 mg total) by mouth 3 (three) times daily as needed for muscle spasms., Disp: 30 tablet, Rfl: 0   doxycycline (ORACEA) 40 MG capsule, Take 40 mg by mouth daily., Disp: , Rfl:    fluticasone (FLONASE) 50 MCG/ACT nasal spray, Place 1 spray into both nostrils daily., Disp: 48 g, Rfl: 1   mirtazapine (REMERON) 7.5 MG tablet, Take 1 tablet (7.5 mg total) by mouth at bedtime., Disp: 90 tablet, Rfl: 1   omega-3 acid ethyl esters (LOVAZA) 1 g capsule, TAKE ONE CAPSULE BY MOUTH THREE TIMES A DAY, Disp: 270 capsule, Rfl: 3   omeprazole (PRILOSEC) 40 MG capsule, Take  1 capsule (40 mg total) by mouth daily., Disp: 90 capsule, Rfl: 1   Probiotic Product (PROBIOTIC PO), Take by mouth., Disp: , Rfl:    RHOFADE 1 % CREA, Apply 1 application topically every morning., Disp: , Rfl:    sertraline (ZOLOFT) 50 MG tablet, Take 1 tablet (50 mg total) by mouth daily., Disp: 90 tablet, Rfl: 1   tadalafil (CIALIS) 20 MG tablet, Take 1 tablet (20 mg total) by mouth daily as needed., Disp: 10 tablet, Rfl: 6   triamcinolone cream (KENALOG) 0.1 %, Apply topically., Disp: , Rfl:   Current Facility-Administered Medications:    0.9 %  sodium chloride infusion, 500 mL, Intravenous, Once, Danis, Kirke Corin, MD  EXAM:  VITALS per patient if applicable:  GENERAL: alert, oriented, appears well and in no acute distress  HEENT: atraumatic, conjunttiva clear, no obvious abnormalities on inspection of external nose and ears  NECK: normal  movements of the head and neck  LUNGS: on inspection no signs of respiratory distress, breathing rate appears normal, no obvious gross SOB, gasping or wheezing  CV: no obvious cyanosis  MS: moves all visible extremities without noticeable abnormality  PSYCH/NEURO: pleasant and cooperative, no obvious depression or anxiety, speech and thought processing grossly intact  ASSESSMENT AND PLAN:  Discussed the following assessment and plan:   1. Adjustment disorder with anxious mood -Will increase his Zoloft to 100 mg daily.  Keep Remeron at 7.5 mg to help him sleep.  Will have him follow-up with me via MyChart message in 30 days to let me know how he is doing. - sertraline (ZOLOFT) 100 MG tablet; Take 1 tablet (100 mg total) by mouth daily.  Dispense: 90 tablet; Refill: 1     I discussed the assessment and treatment plan with the patient. The patient was provided an opportunity to ask questions and all were answered. The patient agreed with the plan and demonstrated an understanding of the instructions.   The patient was advised to call back or seek an in-person evaluation if the symptoms worsen or if the condition fails to improve as anticipated.   [name], NP

## 2022-05-01 ENCOUNTER — Encounter: Payer: Self-pay | Admitting: Adult Health

## 2022-05-01 ENCOUNTER — Other Ambulatory Visit: Payer: Self-pay | Admitting: Adult Health

## 2022-05-01 MED ORDER — ATORVASTATIN CALCIUM 10 MG PO TABS: 10.0000 mg | ORAL_TABLET | Freq: Every day | ORAL | 0 refills | Status: DC

## 2022-05-01 NOTE — Telephone Encounter (Signed)
Rx refilled.

## 2022-05-11 ENCOUNTER — Encounter: Payer: Self-pay | Admitting: Gastroenterology

## 2022-05-11 MED ORDER — OMEPRAZOLE 40 MG PO CPDR: 40.0000 mg | DELAYED_RELEASE_CAPSULE | Freq: Every day | ORAL | 0 refills | Status: DC

## 2022-05-15 ENCOUNTER — Other Ambulatory Visit: Payer: Self-pay | Admitting: Adult Health

## 2022-06-26 ENCOUNTER — Other Ambulatory Visit: Payer: Self-pay | Admitting: Adult Health

## 2022-06-26 DIAGNOSIS — I10 Essential (primary) hypertension: Secondary | ICD-10-CM

## 2022-07-08 NOTE — Telephone Encounter (Signed)
Sleep study results were given to pt when he came to the office. Closing encounter.

## 2022-07-15 ENCOUNTER — Other Ambulatory Visit: Payer: Self-pay | Admitting: Adult Health

## 2022-07-27 ENCOUNTER — Other Ambulatory Visit: Payer: Self-pay | Admitting: Adult Health

## 2022-08-06 ENCOUNTER — Telehealth: Payer: Self-pay | Admitting: Gastroenterology

## 2022-08-06 DIAGNOSIS — K760 Fatty (change of) liver, not elsewhere classified: Secondary | ICD-10-CM | POA: Insufficient documentation

## 2022-08-06 MED ORDER — OMEPRAZOLE 40 MG PO CPDR: 40.0000 mg | DELAYED_RELEASE_CAPSULE | Freq: Every day | ORAL | 0 refills | Status: DC

## 2022-08-06 NOTE — Telephone Encounter (Signed)
Patient called requesting a medication refill for Omeprazole, said he has a follow appt for 08/18/22 but he only has two pills left.

## 2022-08-06 NOTE — Telephone Encounter (Signed)
RX for 30 days given.

## 2022-08-13 ENCOUNTER — Encounter: Payer: Self-pay | Admitting: Adult Health

## 2022-08-14 MED ORDER — TADALAFIL 20 MG PO TABS: 20.0000 mg | ORAL_TABLET | Freq: Every day | ORAL | 6 refills | Status: DC | PRN

## 2022-08-18 ENCOUNTER — Encounter: Payer: Self-pay | Admitting: Gastroenterology

## 2022-08-18 ENCOUNTER — Ambulatory Visit: Payer: 59 | Admitting: Gastroenterology

## 2022-08-18 VITALS — BP 130/80 | HR 76 | Ht 70.0 in | Wt 195.0 lb

## 2022-08-18 DIAGNOSIS — K2100 Gastro-esophageal reflux disease with esophagitis, without bleeding: Secondary | ICD-10-CM | POA: Diagnosis not present

## 2022-08-18 DIAGNOSIS — R1319 Other dysphagia: Secondary | ICD-10-CM | POA: Diagnosis not present

## 2022-08-18 MED ORDER — OMEPRAZOLE 40 MG PO CPDR: 40.0000 mg | DELAYED_RELEASE_CAPSULE | Freq: Every day | ORAL | 2 refills | Status: DC

## 2022-08-18 NOTE — Patient Instructions (Signed)
_______________________________________________________  If your blood pressure at your visit was 140/90 or greater, please contact your primary care physician to follow up on this.  _______________________________________________________  If you are age 59 or older, your body mass index should be between 23-30. Your Body mass index is 27.98 kg/m. If this is out of the aforementioned range listed, please consider follow up with your Primary Care Provider.  If you are age 72 or younger, your body mass index should be between 19-25. Your Body mass index is 27.98 kg/m. If this is out of the aformentioned range listed, please consider follow up with your Primary Care Provider.   ________________________________________________________  The [facility] providers would like to encourage you to use [facility] to communicate with providers for non-urgent requests or questions.  Due to long hold times on the telephone, sending your provider a message by [facility] may be a faster and more efficient way to get a response.  Please allow 48 business hours for a response.  Please remember that this is for non-urgent requests.  _______________________________________________________  It was a pleasure to see you today!  Thank you for trusting me with your gastrointestinal care!

## 2022-08-18 NOTE — Progress Notes (Signed)
[facility] Progress Note  Chief Complaint: GERD with prior esophagitis.  Subjective  History: Summary of GI issues: Initially seen April 2024 with heartburn and dysphagia.  Upper endoscopy with severe stricturing reflux esophagitis.  Underwent balloon dilation and patient put on twice daily PPI. Repeat upper endoscopy October 2022 with resolution of esophagitis and stricture.  PPI then decrease to once daily and patient engaging in antireflux diet and lifestyle measures. At March 2023 office follow-up, he continued to feel well on that therapy, recommended decreasing PPI to every other day as long as symptoms remain under good control,, return daily PPI if not.  Also discussion at that time regarding candidacy for TIF procedure, and he wished to give it further consideration at that point.  He reports feeling well today with good control of symptoms as long as he takes his omeprazole daily.  At 1 point he ran out of the prescription and went without it for some time and had noticeable recurrence of regurgitation and heartburn.  Occasional brief dysphagia if he eats too quickly, but "nothing like before".  ROS: Cardiovascular:  no chest pain Respiratory: no dyspnea  The patient's Past Medical, Family and Social History were reviewed and are on file in the EMR.  Objective:  Med list reviewed  Current Outpatient Medications:    amLODipine-benazepril (LOTREL) 5-20 MG capsule, TAKE ONE CAPSULE BY MOUTH DAILY, Disp: 90 capsule, Rfl: 1   atorvastatin (LIPITOR) 10 MG tablet, TAKE ONE TABLET BY MOUTH DAILY, Disp: 90 tablet, Rfl: 0   Brimonidine Tartrate (MIRVASO) 0.33 % GEL, Apply topically., Disp: , Rfl:    cyclobenzaprine (FLEXERIL) 10 MG tablet, Take 1 tablet (10 mg total) by mouth 3 (three) times daily as needed for muscle spasms., Disp: 30 tablet, Rfl: 0   doxycycline (ORACEA) 40 MG capsule, Take 40 mg by mouth daily., Disp: , Rfl:    doxycycline (ORACEA) 40 MG capsule, Take by  mouth., Disp: , Rfl:    fluticasone (FLONASE) 50 MCG/ACT nasal spray, SPRAY ONE SPRAY IN EACH NOSTRIL ONCE DAILY, Disp: 16 mL, Rfl: 0   mirtazapine (REMERON) 7.5 MG tablet, Take 1 tablet (7.5 mg total) by mouth at bedtime., Disp: 90 tablet, Rfl: 1   omega-3 acid ethyl esters (LOVAZA) 1 g capsule, TAKE ONE CAPSULE BY MOUTH THREE TIMES A DAY, Disp: 270 capsule, Rfl: 3   Oxymetazoline HCl (RHOFADE) 1 % CREA, Apply topically., Disp: , Rfl:    RHOFADE 1 % CREA, Apply 1 application topically every morning., Disp: , Rfl:    sertraline (ZOLOFT) 100 MG tablet, Take 1 tablet (100 mg total) by mouth daily., Disp: 90 tablet, Rfl: 1   tadalafil (CIALIS) 20 MG tablet, Take 1 tablet (20 mg total) by mouth daily as needed., Disp: 10 tablet, Rfl: 6   triamcinolone cream (KENALOG) 0.1 %, Apply topically., Disp: , Rfl:    omeprazole (PRILOSEC) 40 MG capsule, Take 1 capsule (40 mg total) by mouth daily., Disp: 90 capsule, Rfl: 2  Current Facility-Administered Medications:    0.9 %  sodium chloride infusion, 500 mL, Intravenous, Once, Danis, Starr Lake III, MD   Vital signs in last 24 hrs: Vitals:   08/18/22 0928  BP: 130/80  Pulse: 76   Wt Readings from Last 3 Encounters:  08/18/22 195 lb (88.5 kg)  04/15/22 193 lb (87.5 kg)  11/21/21 193 lb (87.5 kg)    Physical Exam  Well-appearing, normal vocal quality Exam-entire visit spent in review of symptoms and plan  Labs:   ___________________________________________ Radiologic studies:   ____________________________________________ Other:   _____________________________________________ Assessment & Plan  Assessment: Encounter Diagnoses  Name Primary?   Gastroesophageal reflux disease with esophagitis without hemorrhage Yes   Esophageal dysphagia    Previously severe reflux esophagitis with stricture, considerable endoscopic and symptomatic improvement on acid suppression therapy. We discussed the limitation of acid suppression therapy, breadth  of diet and lifestyle changes required for reflux control, the possibility of reflux related complications such as esophagitis, stricture or Barrett's esophagus.  Other anatomic considerations such as gastric outlet obstruction or hiatal hernia may contribute to reflux. I have reviewed the indications, risks, and benefits of PPI therapy with the patient today. I have discussed studies that raise ? of increased osteoporosis, and kidney failure and explained that these studies show very weak associations of unclear significance and not clear cause and effect. We did discuss the potential for vitamin malabsorption, to include magnesium (very rare), calcium (easily modifiable with Calcium Citrate supplement), vitamin B12 (again, correctable with oral B12 supplement), and iron (although rarely clinically significant outside patients who require iron supplementation previously), and can monitor each of these periodically with routine labs. We have agreed to continue PPI treatment in this case. He will bring this up with primary care to make sure are checked at his routine health maintenance visits.  We again discussed the possibility of TIF, and he would like to give them more consideration.  If he wishes to learn more, he will let me know and I can set him up to visit with Dr. Barron Alvine.  Otherwise, he will continue current management and I will plan to see him in a year or sooner if needed.  20 minutes were spent on this encounter (including chart review, history/exam, counseling/coordination of care, and documentation) > 50% of that time was spent on counseling and coordination of care.   Raymond Johnson

## 2022-08-28 ENCOUNTER — Other Ambulatory Visit: Payer: Self-pay | Admitting: Adult Health

## 2022-08-28 ENCOUNTER — Encounter: Payer: Self-pay | Admitting: Adult Health

## 2022-08-28 NOTE — Telephone Encounter (Signed)
Please advise 

## 2022-09-18 ENCOUNTER — Encounter: Payer: Self-pay | Admitting: Family Medicine

## 2022-09-18 ENCOUNTER — Ambulatory Visit: Payer: 59 | Admitting: Family Medicine

## 2022-09-18 VITALS — BP 132/82 | HR 82 | Temp 98.9°F | Wt 199.4 lb

## 2022-09-18 DIAGNOSIS — J302 Other seasonal allergic rhinitis: Secondary | ICD-10-CM

## 2022-09-18 DIAGNOSIS — R051 Acute cough: Secondary | ICD-10-CM | POA: Diagnosis not present

## 2022-09-18 DIAGNOSIS — J40 Bronchitis, not specified as acute or chronic: Secondary | ICD-10-CM

## 2022-09-18 MED ORDER — BENZONATATE 200 MG PO CAPS
200.0000 mg | ORAL_CAPSULE | Freq: Two times a day (BID) | ORAL | 0 refills | Status: DC | PRN
Start: 2022-09-18 — End: 2022-11-26

## 2022-09-18 MED ORDER — FEXOFENADINE HCL 180 MG PO TABS
180.0000 mg | ORAL_TABLET | Freq: Every day | ORAL | 0 refills | Status: AC
Start: 2022-09-18 — End: ?

## 2022-09-18 MED ORDER — PREDNISONE 20 MG PO TABS
40.0000 mg | ORAL_TABLET | Freq: Every day | ORAL | 0 refills | Status: AC
Start: 2022-09-18 — End: 2022-09-21

## 2022-09-18 NOTE — Patient Instructions (Addendum)
A prescription was sent in for prednisone, Tessalon, and Allegra (a pill for allergies).  The Allegra was sent in in case the Zyrtec was not helpful we could try something different.

## 2022-09-18 NOTE — Progress Notes (Signed)
Established Patient Office Visit   Subjective  Patient ID: ESAIAS KROMER, male    DOB: Dec 28, 1965  Age: 57 y.o. MRN: [id number]  Chief Complaint  Patient presents with   Cough    Cough for 3 weeks, went to UC 2 wks ago, still not getting better. Has been using the inhaler, ad tessalon perles no thelping     Patient is a 57 year old male followed by Dr. Caryl Never and seen for ongoing concern.  Patient seen at [facility] a few weeks ago for cough, diagnosed with bronchitis.  Given albuterol inhaler and Tessalon.  Patient notes slight improvement in symptoms but cough has lingered.  Also notes mild rhinorrhea, headache from coughing, and throat irritation.  Denies fever, chills, nausea, vomiting, facial pain, facial pressure, ear pain/ear pressure.    Past Medical History:  Diagnosis Date   Allergy    Anxiety    Chronic headaches    stress headaches   Diverticulitis    ED (erectile dysfunction)    Elevated liver enzymes    history being elevated   Fatty liver    GERD (gastroesophageal reflux disease)    Gout    >10 years from last episode   History of colonic polyps    History of kidney stones    HLD (hyperlipidemia)    Hypertension    Seasonal allergies    Sleep apnea    not using CPAP   Past Surgical History:  Procedure Laterality Date   COLONOSCOPY  2016   [facility]    COLONOSCOPY     EXTRACORPOREAL SHOCK WAVE LITHOTRIPSY Left 01/28/2017   Procedure: LEFT EXTRACORPOREAL SHOCK WAVE LITHOTRIPSY (ESWL);  Surgeon: Bjorn Pippin, MD;  Location: [facility];  Service: Urology;  Laterality: Left;   HERNIA REPAIR     at age 71 years   POLYPECTOMY     skin cancer      not cancer- benign   UPPER GASTROINTESTINAL ENDOSCOPY     Social History   Tobacco Use   Smoking status: Former    Packs/day: 0.25    Years: 31.00    Additional pack years: 0.00    Total pack years: 7.75    Types: Cigarettes    Start date: 48    Quit date: 2014    Years since quitting: 10.4    Smokeless tobacco: Never   Tobacco comments:    quit from age 39 for 4 years   Vaping Use   Vaping Use: Never used  Substance Use Topics   Alcohol use: Yes    Comment: "socially"   Drug use: No   Family History  Problem Relation Age of Onset   Breast cancer Mother    Lung cancer Father    Prostate cancer Brother 57   Cancer Paternal Grandfather        ? colon    Colon cancer Neg Hx    Esophageal cancer Neg Hx    Rectal cancer Neg Hx    Stomach cancer Neg Hx    Colon polyps Neg Hx    No Known Allergies    ROS Negative unless stated above    Objective:     BP 132/82 (BP Location: Left Arm, Patient Position: Sitting, Cuff Size: Large)   Pulse 82   Temp 98.9 F (37.2 C) (Oral)   Wt 199 lb 6.4 oz (90.4 kg)   SpO2 96%   BMI 28.61 kg/m    Physical Exam Constitutional:  General: He is not in acute distress.    Appearance: Normal appearance.  HENT:     Head: Normocephalic and atraumatic.     Right Ear: Tympanic membrane normal.     Left Ear: Tympanic membrane normal.     Nose: Nose normal.     Mouth/Throat:     Mouth: Mucous membranes are moist.     Comments: Postnasal drainage. Eyes:     Extraocular Movements: Extraocular movements intact.     Conjunctiva/sclera: Conjunctivae normal.  Cardiovascular:     Rate and Rhythm: Normal rate and regular rhythm.     Heart sounds: Normal heart sounds. No murmur heard.    No gallop.  Pulmonary:     Effort: Pulmonary effort is normal. No respiratory distress.     Breath sounds: Normal breath sounds. No wheezing, rhonchi or rales.  Lymphadenopathy:     Cervical: No cervical adenopathy.  Skin:    General: Skin is warm and dry.  Neurological:     Mental Status: He is alert and oriented to person, place, and time.      No results found for any visits on 09/18/22.    Assessment & Plan:  Bronchitis -     predniSONE; Take 2 tablets (40 mg total) by mouth daily with breakfast for 3 days.  Dispense: 6 tablet;  Refill: 0 -     Benzonatate; Take 1 capsule (200 mg total) by mouth 2 (two) times daily as needed for cough.  Dispense: 20 capsule; Refill: 0  Seasonal allergic rhinitis, unspecified trigger -     Fexofenadine HCl; Take 1 tablet (180 mg total) by mouth daily.  Dispense: 30 tablet; Refill: 0  Acute cough   Continued cough 2/2 bronchitis and likely worsened by postnasal drip due to seasonal allergies.  Discussed likely duration of bronchitis symptoms.  Reviewed proper inhaler use and indications.  Refilled Tessalon.  Prednisone burst.  Discussed antihistamine.  Given samples of Zyrtec.  If not helpful can try Allegra, rx sent.  Given strict precautions.   Return if symptoms worsen or fail to improve.   Deeann Saint, MD

## 2022-09-23 ENCOUNTER — Encounter: Payer: Self-pay | Admitting: Adult Health

## 2022-09-25 MED ORDER — TADALAFIL 20 MG PO TABS: 20.0000 mg | ORAL_TABLET | Freq: Every day | ORAL | 6 refills | Status: DC | PRN

## 2022-10-06 ENCOUNTER — Encounter: Payer: Self-pay | Admitting: Adult Health

## 2022-10-06 DIAGNOSIS — G4700 Insomnia, unspecified: Secondary | ICD-10-CM

## 2022-10-06 DIAGNOSIS — F4322 Adjustment disorder with anxiety: Secondary | ICD-10-CM

## 2022-10-06 IMAGING — DX DG FOOT COMPLETE 3+V*R*
3 series · 3 of 3 positions shown · non-contrast
Comparison: None.

CLINICAL DATA: First toe pain for several months, no known injury,
initial encounter

EXAM:
RIGHT FOOT COMPLETE - 3+ VIEW

[foot ap]
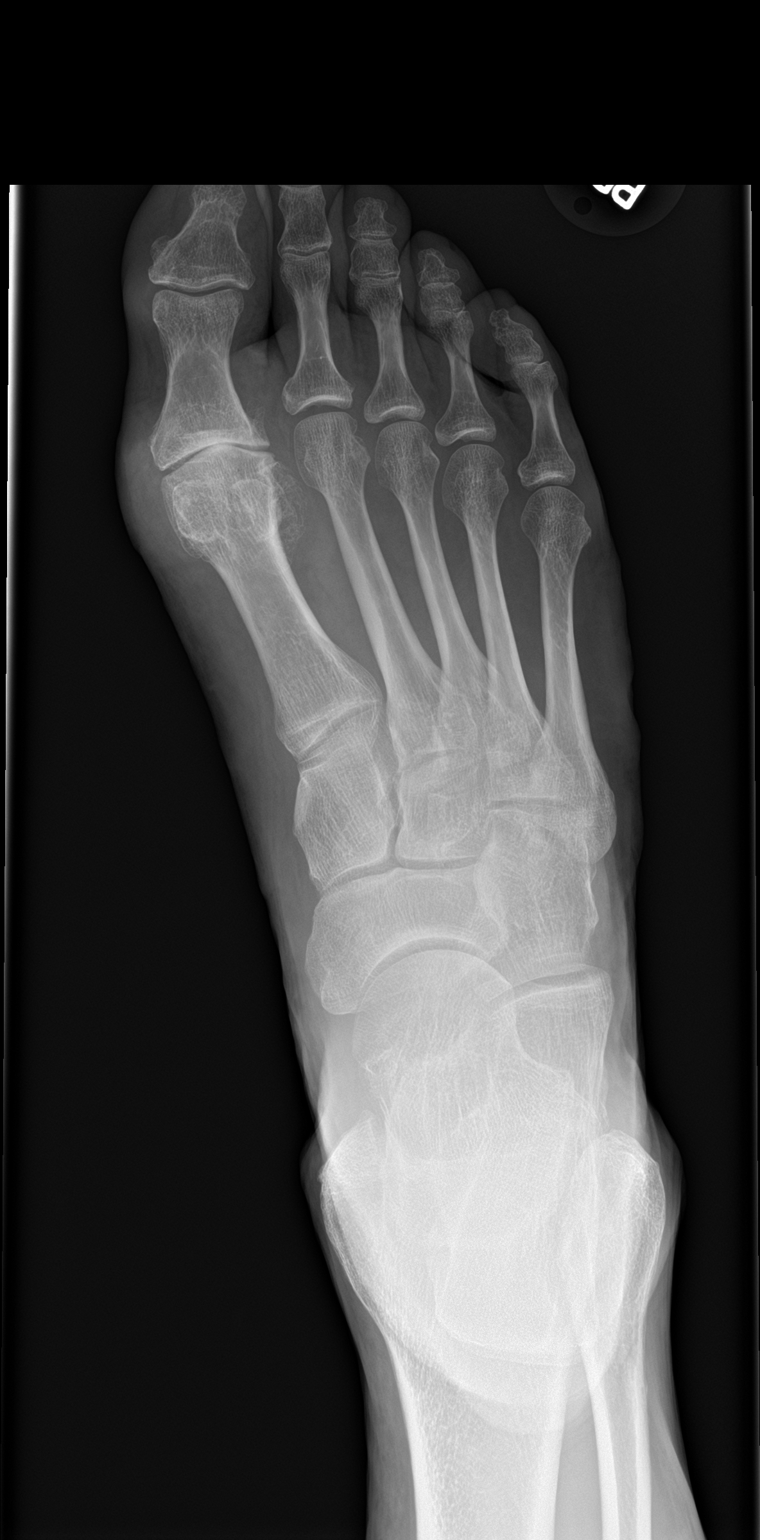

[foot obl]
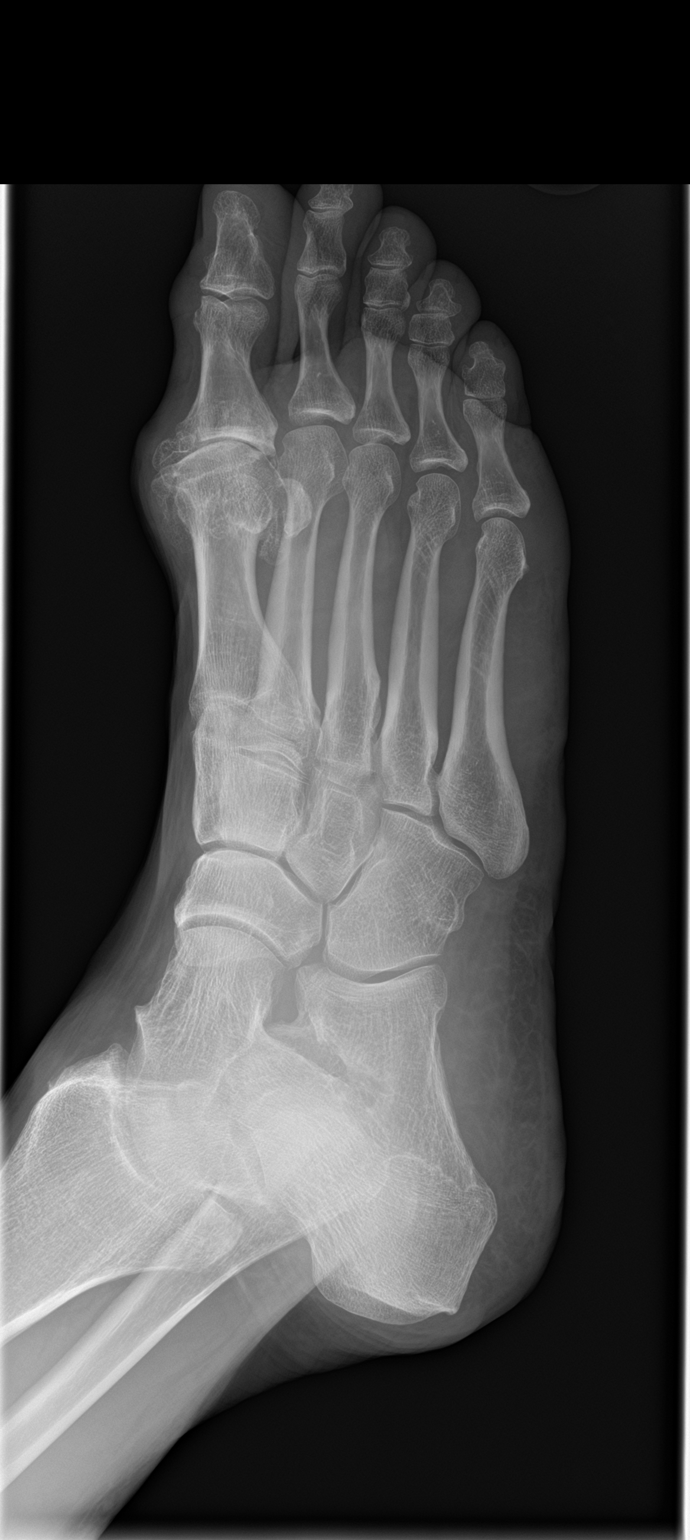

[foot lat]
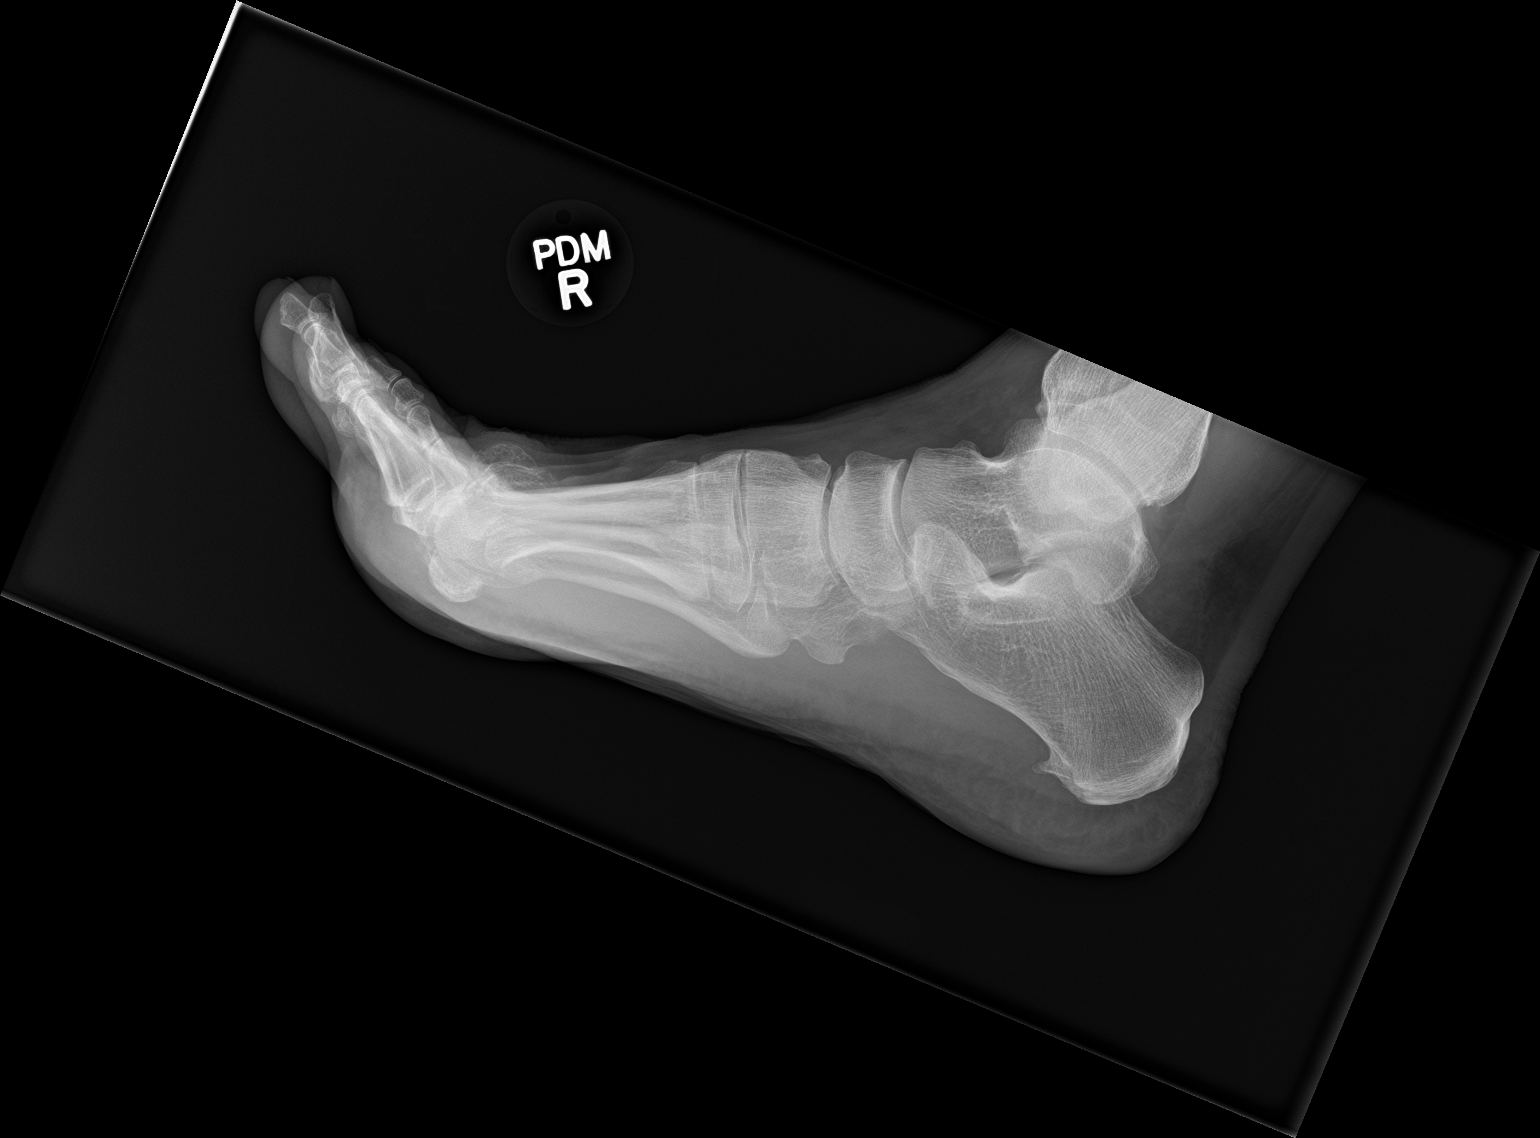

[3 of 3 positions shown; findings below may reference images not displayed]

FINDINGS: Degenerative changes of the first MTP joint are noted. Mild soft
tissue swelling is seen. Adjacent calcifications are noted which may
represent gouty tophi. No fracture is seen. Calcaneal spurring is
noted.
IMPRESSION: Degenerative changes at the first MTP joint with findings suspicious
for gouty tophi.

## 2022-10-07 MED ORDER — MIRTAZAPINE 7.5 MG PO TABS
7.5000 mg | ORAL_TABLET | Freq: Every day | ORAL | 1 refills | Status: DC
Start: 2022-10-07 — End: 2023-04-09

## 2022-10-07 MED ORDER — FLUTICASONE PROPIONATE 50 MCG/ACT NA SUSP: 1.0000 | Freq: Every day | NASAL | 0 refills | Status: DC

## 2022-10-27 ENCOUNTER — Encounter: Payer: Self-pay | Admitting: Adult Health

## 2022-10-28 MED ORDER — ATORVASTATIN CALCIUM 10 MG PO TABS: 10.0000 mg | ORAL_TABLET | Freq: Every day | ORAL | 0 refills | Status: DC

## 2022-11-05 ENCOUNTER — Encounter (INDEPENDENT_AMBULATORY_CARE_PROVIDER_SITE_OTHER): Payer: Self-pay

## 2022-11-26 ENCOUNTER — Telehealth: Payer: Self-pay | Admitting: Adult Health

## 2022-11-26 ENCOUNTER — Encounter: Payer: Self-pay | Admitting: Adult Health

## 2022-11-26 ENCOUNTER — Ambulatory Visit (INDEPENDENT_AMBULATORY_CARE_PROVIDER_SITE_OTHER): Payer: 59 | Admitting: Adult Health

## 2022-11-26 VITALS — BP 120/80 | HR 79 | Temp 98.2°F | Ht 69.25 in | Wt 200.0 lb

## 2022-11-26 DIAGNOSIS — Z0000 Encounter for general adult medical examination without abnormal findings: Secondary | ICD-10-CM

## 2022-11-26 DIAGNOSIS — I10 Essential (primary) hypertension: Secondary | ICD-10-CM | POA: Diagnosis not present

## 2022-11-26 DIAGNOSIS — E782 Mixed hyperlipidemia: Secondary | ICD-10-CM

## 2022-11-26 DIAGNOSIS — R748 Abnormal levels of other serum enzymes: Secondary | ICD-10-CM

## 2022-11-26 DIAGNOSIS — N529 Male erectile dysfunction, unspecified: Secondary | ICD-10-CM

## 2022-11-26 DIAGNOSIS — F419 Anxiety disorder, unspecified: Secondary | ICD-10-CM

## 2022-11-26 DIAGNOSIS — Z125 Encounter for screening for malignant neoplasm of prostate: Secondary | ICD-10-CM

## 2022-11-26 DIAGNOSIS — F5102 Adjustment insomnia: Secondary | ICD-10-CM

## 2022-11-26 DIAGNOSIS — D709 Neutropenia, unspecified: Secondary | ICD-10-CM

## 2022-11-26 DIAGNOSIS — K219 Gastro-esophageal reflux disease without esophagitis: Secondary | ICD-10-CM

## 2022-11-26 LAB — COMPREHENSIVE METABOLIC PANEL
ALT: 65 U/L — ABNORMAL HIGH (ref 0–53)
AST: 62 U/L — ABNORMAL HIGH (ref 0–37)
Albumin: 4.6 g/dL (ref 3.5–5.2)
Alkaline Phosphatase: 77 U/L (ref 39–117)
BUN: 13 mg/dL (ref 6–23)
CO2: 24 meq/L (ref 19–32)
Calcium: 9.4 mg/dL (ref 8.4–10.5)
Chloride: 106 meq/L (ref 96–112)
Creatinine, Ser: 1 mg/dL (ref 0.40–1.50)
GFR: 82.56 mL/min (ref >60.00)
Glucose, Bld: 90 mg/dL (ref 70–99)
Potassium: 4 meq/L (ref 3.5–5.1)
Sodium: 139 meq/L (ref 135–145)
Total Bilirubin: 0.9 mg/dL (ref 0.2–1.2)
Total Protein: 6.9 g/dL (ref 6.0–8.3)

## 2022-11-26 LAB — LIPID PANEL
Cholesterol: 165 mg/dL (ref 0–200)
HDL: 75.5 mg/dL (ref >39.00)
LDL Cholesterol: 69 mg/dL (ref 0–99)
NonHDL: 89.2
Total CHOL/HDL Ratio: 2
Triglycerides: 103 mg/dL (ref 0.0–149.0)
VLDL: 20.6 mg/dL (ref 0.0–40.0)

## 2022-11-26 LAB — CBC
HCT: 48.1 % (ref 39.0–52.0)
Hemoglobin: 16 g/dL (ref 13.0–17.0)
MCHC: 33.3 g/dL (ref 30.0–36.0)
MCV: 100.5 fl — ABNORMAL HIGH (ref 78.0–100.0)
Platelets: 118 10*3/uL — ABNORMAL LOW (ref 150.0–400.0)
RBC: 4.79 Mil/uL (ref 4.22–5.81)
RDW: 13.3 % (ref 11.5–15.5)
WBC: 2.9 10*3/uL — ABNORMAL LOW (ref 4.0–10.5)

## 2022-11-26 LAB — PSA: PSA: 0.62 ng/mL (ref 0.10–4.00)

## 2022-11-26 MED ORDER — ATORVASTATIN CALCIUM 10 MG PO TABS
10.0000 mg | ORAL_TABLET | Freq: Every day | ORAL | 3 refills | Status: DC
Start: 2022-11-26 — End: 2023-12-21

## 2022-11-26 MED ORDER — CLONAZEPAM 0.5 MG PO TABS
0.5000 mg | ORAL_TABLET | Freq: Two times a day (BID) | ORAL | 1 refills | Status: DC | PRN
Start: 2022-11-26 — End: 2023-04-27

## 2022-11-26 NOTE — Progress Notes (Addendum)
Subjective:    Patient ID: Raymond Johnson, male    DOB: 01-09-1966, 57 y.o.   MRN: [id number]  HPI  Patient presents for yearly preventative medicine examination. He is a pleasant 57 year old male who  has a past medical history of Allergy, Anxiety, Chronic headaches, Diverticulitis, ED (erectile dysfunction), Elevated liver enzymes, Fatty liver, GERD (gastroesophageal reflux disease), Gout, History of colonic polyps, History of kidney stones, HLD (hyperlipidemia), Hypertension, Seasonal allergies, and Sleep apnea.  Hypertension-managed with Lotrel 5-20 mg daily.  He denies dizziness, lightheadedness, chest pain, or shortness of breath BP Readings from Last 3 Encounters:  11/26/22 120/80  09/18/22 132/82  08/18/22 130/80    Anxiety-prescribed Zoloft 100 mg.  He is going through a separation and feels as though his anxiety has increased.This does not happen all the time but does happen during stressful situations  Insomnia-takes Remeron 7.5 mg daily.  He does sleep well on this dose.  Denies side effects such as groggy feeling when he wakes up, increased appetite, or weight gain  Hyperlipidemia-managed with Lipitor 10 mg daily and Lovaza 1 g 3 times daily. He is interested in Calcium Scoring CT  Lab Results  Component Value Date   CHOL 165 11/26/2022   HDL 75.50 11/26/2022   LDLCALC 69 11/26/2022   TRIG 103.0 11/26/2022   CHOLHDL 2 11/26/2022   ED - prescribed cialis 20 mg PRN    GERD -managed with Prilosec 40 mg daily and his symptoms are controlled.  His last endoscopy and 07/2020 showed LA grade D reflux esophagitis with no bleeding.  OSA - recently had an oral appliance fit and reports that this has improved his sleep   All immunizations and health maintenance protocols were reviewed with the patient and needed orders were placed.  Appropriate screening laboratory values were ordered for the patient including screening of hyperlipidemia, renal function and hepatic  function. If indicated by BPH, a PSA was ordered.  Medication reconciliation,  past medical history, social history, problem list and allergies were reviewed in detail with the patient  Goals were established with regard to weight loss, exercise, and  diet in compliance with medications  Wt Readings from Last 3 Encounters:  11/26/22 200 lb (90.7 kg)  09/18/22 199 lb 6.4 oz (90.4 kg)  08/18/22 195 lb (88.5 kg)   He is up to date on routine colon cancer screening    Review of Systems  Constitutional: Negative.   HENT: Negative.    Eyes: Negative.   Respiratory: Negative.    Cardiovascular: Negative.   Gastrointestinal: Negative.   Endocrine: Negative.   Genitourinary: Negative.   Musculoskeletal: Negative.   Skin: Negative.   Allergic/Immunologic: Negative.   Neurological: Negative.   Hematological: Negative.   Psychiatric/Behavioral: Negative.    All other systems reviewed and are negative.  Past Medical History:  Diagnosis Date   Allergy    Anxiety    Chronic headaches    stress headaches   Diverticulitis    ED (erectile dysfunction)    Elevated liver enzymes    history being elevated   Fatty liver    GERD (gastroesophageal reflux disease)    Gout    >10 years from last episode   History of colonic polyps    History of kidney stones    HLD (hyperlipidemia)    Hypertension    Seasonal allergies    Sleep apnea    not using CPAP    Social History   Socioeconomic History  Marital status: Single    Spouse name: Not on file   Number of children: 3   Years of education: Not on file   Highest education level: Not on file  Occupational History   Occupation: Sales  Tobacco Use   Smoking status: Former    Current packs/day: 0.00    Average packs/day: 0.3 packs/day for 31.0 years (7.8 ttl pk-yrs)    Types: Cigarettes    Start date: 25    Quit date: 2014    Years since quitting: 10.7   Smokeless tobacco: Never   Tobacco comments:    quit from age 89  for 4 years   Vaping Use   Vaping status: Never Used  Substance and Sexual Activity   Alcohol use: Yes    Comment: "socially"   Drug use: No   Sexual activity: Not on file  Other Topics Concern   Not on file  Social History Narrative   He works in Manufacturing systems engineer - Works in [employer]      Not married - Divorced      Three children - 2 live locally, one in [place]      Social Determinants of Health   Financial Resource Strain: Not on file  Food Insecurity: Not on file  Transportation Needs: Not on file  Physical Activity: Not on file  Stress: Not on file  Social Connections: Unknown (09/04/2022)   Received [facility]   Social Network    Social Network: Not on file  Intimate Partner Violence: Unknown (09/04/2022)   Received from [facility], [facility]   HITS    Physically Hurt: Not on file    Insult or Talk Down To: Not on file    Threaten Physical Harm: Not on file    Scream or Curse: Not on file    Past Surgical History:  Procedure Laterality Date   COLONOSCOPY  2016   [facility]    COLONOSCOPY     EXTRACORPOREAL SHOCK WAVE LITHOTRIPSY Left 01/28/2017   Procedure: LEFT EXTRACORPOREAL SHOCK WAVE LITHOTRIPSY (ESWL);  Surgeon: Bjorn Pippin, MD;  Location: [facility];  Service: Urology;  Laterality: Left;   HERNIA REPAIR     at age 65 years   POLYPECTOMY     skin cancer      not cancer- benign   UPPER GASTROINTESTINAL ENDOSCOPY      Family History  Problem Relation Age of Onset   Breast cancer Mother    Lung cancer Father    Prostate cancer Brother 48   Cancer Paternal Grandfather        ? colon    Colon cancer Neg Hx    Esophageal cancer Neg Hx    Rectal cancer Neg Hx    Stomach cancer Neg Hx    Colon polyps Neg Hx     No Known Allergies  Current Outpatient Medications on File Prior to Visit  Medication Sig Dispense Refill   amLODipine-benazepril (LOTREL) 5-20 MG capsule TAKE ONE CAPSULE BY MOUTH DAILY 90  capsule 1   Brimonidine Tartrate (MIRVASO) 0.33 % GEL Apply topically.     doxycycline (ORACEA) 40 MG capsule Take 40 mg by mouth daily.     fexofenadine (ALLEGRA) 180 MG tablet Take 1 tablet (180 mg total) by mouth daily. 30 tablet 0   fluticasone (FLONASE) 50 MCG/ACT nasal spray Place 1 spray into both nostrils daily. SPRAY ONE SPRAY IN EACH NOSTRIL ONCE DAILY 16 mL 0   mirtazapine (  REMERON) 7.5 MG tablet Take 1 tablet (7.5 mg total) by mouth at bedtime. 90 tablet 1   omega-3 acid ethyl esters (LOVAZA) 1 g capsule TAKE ONE CAPSULE BY MOUTH THREE TIMES A DAY 270 capsule 3   omeprazole (PRILOSEC) 40 MG capsule Take 1 capsule (40 mg total) by mouth daily. 90 capsule 2   Oxymetazoline HCl (RHOFADE) 1 % CREA Apply topically.     sertraline (ZOLOFT) 100 MG tablet Take 1 tablet (100 mg total) by mouth daily. 90 tablet 1   tadalafil (CIALIS) 20 MG tablet Take 1 tablet (20 mg total) by mouth daily as needed. 10 tablet 6   triamcinolone cream (KENALOG) 0.1 % Apply topically.     No current facility-administered medications on file prior to visit.    BP 120/80   Pulse 79   Temp 98.2 F (36.8 C) (Oral)   Ht 5' 9.25" (1.759 m)   Wt 200 lb (90.7 kg)   SpO2 97%   BMI 29.32 kg/m       Objective:   Physical Exam Vitals and nursing note reviewed.  Constitutional:      General: He is not in acute distress.    Appearance: Normal appearance. He is not ill-appearing.  HENT:     Head: Normocephalic and atraumatic.     Right Ear: Tympanic membrane, ear canal and external ear normal. There is no impacted cerumen.     Left Ear: Tympanic membrane, ear canal and external ear normal. There is no impacted cerumen.     Nose: Nose normal. No congestion or rhinorrhea.     Mouth/Throat:     Mouth: Mucous membranes are moist.     Pharynx: Oropharynx is clear.  Eyes:     Extraocular Movements: Extraocular movements intact.     Conjunctiva/sclera: Conjunctivae normal.     Pupils: Pupils are equal, round,  and reactive to light.  Neck:     Vascular: No carotid bruit.  Cardiovascular:     Rate and Rhythm: Normal rate and regular rhythm.     Pulses: Normal pulses.     Heart sounds: No murmur heard.    No friction rub. No gallop.  Pulmonary:     Effort: Pulmonary effort is normal.     Breath sounds: Normal breath sounds.  Abdominal:     General: Abdomen is flat. Bowel sounds are normal. There is no distension.     Palpations: Abdomen is soft. There is no mass.     Tenderness: There is no abdominal tenderness. There is no guarding or rebound.     Hernia: No hernia is present.  Musculoskeletal:        General: Normal range of motion.     Cervical back: Normal range of motion and neck supple.  Lymphadenopathy:     Cervical: No cervical adenopathy.  Skin:    General: Skin is warm and dry.     Capillary Refill: Capillary refill takes less than 2 seconds.  Neurological:     General: No focal deficit present.     Mental Status: He is alert and oriented to person, place, and time.  Psychiatric:        Mood and Affect: Mood normal.        Behavior: Behavior normal.        Thought Content: Thought content normal.        Judgment: Judgment normal.        Assessment & Plan:  1. Routine general medical examination at a  health care facility Today patient counseled on age appropriate routine health concerns for screening and prevention, each reviewed and up to date or declined. Immunizations reviewed and up to date or declined. Labs ordered and reviewed. Risk factors for depression reviewed and negative. Hearing function and visual acuity are intact. ADLs screened and addressed as needed. Functional ability and level of safety reviewed and appropriate. Education, counseling and referrals performed based on assessed risks today. Patient provided with a copy of personalized plan for preventive services. - Follow up in one year or sooner if needed  2. Essential hypertension - Well controlled. No  change in medication  - Lipid panel; Future - CBC; Future - Comprehensive metabolic panel; Future - Comprehensive metabolic panel - CBC - Lipid panel  3. Anxiety - Will add klonopin 0.5 mg to take PRN  - Continue with SSRI  - Lipid panel; Future - CBC; Future - Comprehensive metabolic panel; Future - Comprehensive metabolic panel - CBC - Lipid panel  4. Adjustment insomnia - Continue with Remeron  - Lipid panel; Future - CBC; Future - Comprehensive metabolic panel; Future - Comprehensive metabolic panel - CBC - Lipid panel  5. Mixed hyperlipidemia  - Lipid panel; Future - CBC; Future - Comprehensive metabolic panel; Future - atorvastatin (LIPITOR) 10 MG tablet; Take 1 tablet (10 mg total) by mouth daily.  Dispense: 90 tablet; Refill: 3 - Comprehensive metabolic panel - CBC - Lipid panel  6. Erectile dysfunction, unspecified erectile dysfunction type - Continue with Cialis as needed - Lipid panel; Future - CBC; Future - Comprehensive metabolic panel; Future - Comprehensive metabolic panel - CBC - Lipid panel  7. Gastroesophageal reflux disease without esophagitis - Continue PPI  - Lipid panel; Future - CBC; Future - Comprehensive metabolic panel; Future - Comprehensive metabolic panel - CBC - Lipid panel  8. Prostate cancer screening  - PSA; Future   Shirline Frees, NP

## 2022-11-26 NOTE — Patient Instructions (Signed)
It was great seeing you today   We will follow up with you regarding your lab work   Please let me know if you need anything   

## 2022-11-26 NOTE — Telephone Encounter (Signed)
Updated patient on his labs.   His AST/ALT continues to be chronically high and his WBC and Plt chronically low.   I am going to order an [place] and refer to hematology

## 2022-12-03 ENCOUNTER — Encounter: Payer: Self-pay | Admitting: Adult Health

## 2022-12-04 NOTE — Telephone Encounter (Signed)
Please advise 

## 2022-12-11 ENCOUNTER — Encounter: Payer: Self-pay | Admitting: Adult Health

## 2022-12-11 ENCOUNTER — Ambulatory Visit
Admission: RE | Admit: 2022-12-11 | Discharge: 2022-12-11 | Disposition: A | Discharge status: Home / Self Care | Payer: 59 | Source: Ambulatory Visit | Attending: Adult Health

## 2022-12-11 DIAGNOSIS — R748 Abnormal levels of other serum enzymes: Secondary | ICD-10-CM

## 2022-12-15 NOTE — Telephone Encounter (Signed)
Please advise 

## 2022-12-28 NOTE — Progress Notes (Unsigned)
[facility] CONSULT NOTE  Patient Care Team: Shirline Frees, NP as PCP - General (Family Medicine)  ASSESSMENT & PLAN 57 y.o.male with history of fatty liver, GERD, gout, hypertension, hyperlipidemia, sleep apnea, chronic headache referred here for leukopenia and thrombocytopenia  Records showed mild thrombocytopenia, borderline leukopenia with lymphopenia, macrocytosis.  Normal neutrophil counts in September 2024.  Mild thrombocytopenia and borderline leukopenia dating back to 2020 from [facility] health record and 2016 from [place].  History also recorded hepatic steatosis on several image studies, chronically elevated LFT.  Negative for hepatitis C, HIV, ANA.  We discussed differential diagnosis today.  He has no red flags or B-symptoms.  Since he has been ongoing for several years, I recommend continued monitoring.  He is also adhering to a healthy diet and lifestyle to prevent worsening hepatic steatosis.  No problem-specific Assessment & Plan notes found for this encounter.  B12, copper, folate PT, APTT Flow cytometry  If no concerning findings, we will follow-up in 6 months to repeat CBC, CMP prior LDH. All questions were answered. The patient was informed to call the clinic with any problems, questions or concerns. No barriers to learning was detected.  Orders Placed This Encounter  Procedures   Vitamin B12    Standing Status:   Future    Standing Expiration Date:   12/29/2023   Folate    Standing Status:   Future    Standing Expiration Date:   12/29/2023   Copper, serum    Standing Status:   Future    Standing Expiration Date:   12/29/2023   APTT    Standing Status:   Future    Standing Expiration Date:   12/29/2023   Protime-INR    Standing Status:   Future    Standing Expiration Date:   12/29/2023   Flow Cytometry, Peripheral Blood (Oncology)    Rule out LGL or other abnormalities    Standing Status:   Future    Standing Expiration Date:   12/29/2023      Melven Sartorius, MD 12/29/2022 10:11 AM  CHIEF COMPLAINTS/PURPOSE OF CONSULTATION:  Thrombocytopenia and leukopenia  HISTORY OF PRESENTING ILLNESS:  Raymond Johnson 57 y.o. male is here because of leukopenia and thrombocytopenia.  He was found to have abnormal CBC from an annual physical exam. He denies recent unexpected bleeding, hematuria, melena or hematochezia. History recorded mild and borderline thrombocytopenia and leukopenia dating back to 2016.  Reports history of hepatic steatosis.  Hepatitis C was tested negative on several occasions.  He denies any night sweats, unexpected results, decreased appetite or profound fatigue.  From [facility] on 01/22/15 [place]: Hepatic steatosis.    03/04/16 [place]: IMPRESSION:  Diffuse fatty infiltration of the liver. Otherwise unremarkable.   Most recent ultrasound abdomen from 11/2022 as below: 1. Increased hepatic parenchymal echogenicity suggestive of steatosis. 2. No cholelithiasis or sonographic evidence for acute cholecystitis.  His laboratory records show chronically elevated LFT.   There has been no recent new medication to suggest drug induced thrombocytopenia:  Infectious cause: No: History of HIV No: Hepatitis C No: EBV or infectious mononucleosis No: H pylori No: Recent sepsis or infection No: Traveling history suggest parasites exposure  No: Heavy alcohol use No: Dietary imbalance: Vitamin B12, folate, copper deficiency No: Autoimmune disease.  There has been no signs of autoimmune disease physical he No: History of thrombosis No: History of bleeding   MEDICAL HISTORY:  Past Medical History:  Diagnosis Date   Allergy    Anxiety  Chronic headaches    stress headaches   Diverticulitis    ED (erectile dysfunction)    Elevated liver enzymes    history being elevated   Fatty liver    GERD (gastroesophageal reflux disease)    Gout    >10 years from last episode   History of colonic polyps    History of kidney  stones    HLD (hyperlipidemia)    Hypertension    Seasonal allergies    Sleep apnea    not using CPAP    SURGICAL HISTORY: Past Surgical History:  Procedure Laterality Date   COLONOSCOPY  2016   [facility]    COLONOSCOPY     EXTRACORPOREAL SHOCK WAVE LITHOTRIPSY Left 01/28/2017   Procedure: LEFT EXTRACORPOREAL SHOCK WAVE LITHOTRIPSY (ESWL);  Surgeon: Bjorn Pippin, MD;  Location: [facility];  Service: Urology;  Laterality: Left;   HERNIA REPAIR     at age 27 years   POLYPECTOMY     skin cancer      not cancer- benign   UPPER GASTROINTESTINAL ENDOSCOPY      SOCIAL HISTORY: Social History   Socioeconomic History   Marital status: Single    Spouse name: Not on file   Number of children: 3   Years of education: Not on file   Highest education level: Not on file  Occupational History   Occupation: Sales  Tobacco Use   Smoking status: Former    Current packs/day: 0.00    Average packs/day: 0.3 packs/day for 31.0 years (7.8 ttl pk-yrs)    Types: Cigarettes    Start date: 70    Quit date: 2014    Years since quitting: 10.7   Smokeless tobacco: Never   Tobacco comments:    quit from age 9 for 4 years   Vaping Use   Vaping status: Never Used  Substance and Sexual Activity   Alcohol use: Yes    Comment: "socially"   Drug use: No   Sexual activity: Not on file  Other Topics Concern   Not on file  Social History Narrative   He works in Manufacturing systems engineer - Works in [employer]      Not married - Divorced      Three children - 2 live locally, one in [place]      Social Determinants of Health   Financial Resource Strain: Not on file  Food Insecurity: Not on file  Transportation Needs: Not on file  Physical Activity: Not on file  Stress: Not on file  Social Connections: Unknown (09/04/2022)   Received from [facility], [facility]   Social Network    Social Network: Not on file  Intimate Partner Violence: Unknown (09/04/2022)   Received from [facility], [facility]   HITS    Physically Hurt: Not on file    Insult or Talk Down To: Not on file    Threaten Physical Harm: Not on file    Scream or Curse: Not on file    FAMILY HISTORY: Family History  Problem Relation Age of Onset   Breast cancer Mother    Lung cancer Father    Prostate cancer Brother 54   Cancer Paternal Grandfather        ? colon    Colon cancer Neg Hx    Esophageal cancer Neg Hx    Rectal cancer Neg Hx    Stomach cancer Neg Hx    Colon polyps Neg Hx     ALLERGIES:  has No Known Allergies.  MEDICATIONS:  Current Outpatient Medications  Medication Sig Dispense Refill   amLODipine-benazepril (LOTREL) 5-20 MG capsule TAKE ONE CAPSULE BY MOUTH DAILY 90 capsule 1   atorvastatin (LIPITOR) 10 MG tablet Take 1 tablet (10 mg total) by mouth daily. 90 tablet 3   Brimonidine Tartrate (MIRVASO) 0.33 % GEL Apply topically.     clonazePAM (KLONOPIN) 0.5 MG tablet Take 1 tablet (0.5 mg total) by mouth 2 (two) times daily as needed for anxiety. 20 tablet 1   doxycycline (ORACEA) 40 MG capsule Take 40 mg by mouth daily.     fexofenadine (ALLEGRA) 180 MG tablet Take 1 tablet (180 mg total) by mouth daily. 30 tablet 0   fluticasone (FLONASE) 50 MCG/ACT nasal spray Place 1 spray into both nostrils daily. SPRAY ONE SPRAY IN EACH NOSTRIL ONCE DAILY 16 mL 0   mirtazapine (REMERON) 7.5 MG tablet Take 1 tablet (7.5 mg total) by mouth at bedtime. 90 tablet 1   omega-3 acid ethyl esters (LOVAZA) 1 g capsule TAKE ONE CAPSULE BY MOUTH THREE TIMES A DAY 270 capsule 3   omeprazole (PRILOSEC) 40 MG capsule Take 1 capsule (40 mg total) by mouth daily. 90 capsule 2   Oxymetazoline HCl (RHOFADE) 1 % CREA Apply topically.     sertraline (ZOLOFT) 100 MG tablet Take 1 tablet (100 mg total) by mouth daily. 90 tablet 1   tadalafil (CIALIS) 20 MG tablet Take 1 tablet (20 mg total) by mouth daily as needed. 10 tablet 6   triamcinolone cream (KENALOG) 0.1 % Apply topically.     No current  facility-administered medications for this visit.    REVIEW OF SYSTEMS:   Constitutional: Denies fevers, chills or abnormal night sweats Mouth: Denies any petechiae Respiratory: Denies cough, breath or wheezes Cardiovascular: Denies palpitation, chest discomfort  Gastrointestinal:  Denies nausea, diarrhea, heartburn, bloody stool, dark stools Skin: Denies abnormal skin rashes Lymphatics: Denies new lymphadenopathy All other systems were reviewed with the patient and are negative.  PHYSICAL EXAMINATION: ECOG PERFORMANCE STATUS: 0 - Asymptomatic  Vitals:   12/29/22 0935  BP: (!) 145/89  Pulse: 74  Resp: 17  Temp: 98.6 F (37 C)  SpO2: 96%   Filed Weights   12/29/22 0935  Weight: 201 lb 4 oz (91.3 kg)    GENERAL: alert, no distress and comfortable SKIN: skin color normal and no bruising or petechiae on exposed skin.  No rash on visible skin EYES: normal, sclera clear OROPHARYNX: no exudate  NECK: No palpable mass LYMPH:  no palpable cervical, axillary lymphadenopathy  LUNGS: clear to auscultation and percussion with normal breathing effort HEART: regular rate & rhythm and no murmurs  ABDOMEN: abdomen soft, non-tender and nondistended. Musculoskeletal: no edema  LABORATORY DATA:  I have reviewed the data as listed  RADIOGRAPHIC STUDIES: I have personally reviewed the radiological images as listed and agreed with the findings in the report. US Abdomen Limited RUQ (LIVER/GB)  Result Date: 12/11/2022 CLINICAL DATA:  Elevated LFTs EXAM: ULTRASOUND ABDOMEN LIMITED RIGHT UPPER QUADRANT COMPARISON:  Renal stone CT 01/24/2017 FINDINGS: Gallbladder: No gallstones or wall thickening visualized. No sonographic Murphy sign noted by sonographer. Common bile duct: Diameter: 4 mm Liver: Increased echogenicity. No focal lesion. Portal vein is patent on color Doppler imaging with normal direction of blood flow towards the liver. Other: None. IMPRESSION: 1. Increased hepatic parenchymal  echogenicity suggestive of steatosis. 2. No cholelithiasis or sonographic evidence for acute cholecystitis. Electronically Signed   By: Annia Belt  M.D.   On: 12/11/2022 09:36

## 2022-12-29 ENCOUNTER — Inpatient Hospital Stay: Payer: 59

## 2022-12-29 ENCOUNTER — Other Ambulatory Visit (HOSPITAL_COMMUNITY)
Admission: RE | Admit: 2022-12-29 | Discharge: 2022-12-29 | Disposition: A | Discharge status: Home / Self Care | Payer: 59 | Source: Ambulatory Visit

## 2022-12-29 VITALS — BP 145/89 | HR 74 | Temp 98.6°F | Resp 17 | Wt 201.2 lb

## 2022-12-29 DIAGNOSIS — Z87891 Personal history of nicotine dependence: Secondary | ICD-10-CM | POA: Insufficient documentation

## 2022-12-29 DIAGNOSIS — E785 Hyperlipidemia, unspecified: Secondary | ICD-10-CM | POA: Insufficient documentation

## 2022-12-29 DIAGNOSIS — D696 Thrombocytopenia, unspecified: Secondary | ICD-10-CM | POA: Insufficient documentation

## 2022-12-29 DIAGNOSIS — D72810 Lymphocytopenia: Secondary | ICD-10-CM

## 2022-12-29 DIAGNOSIS — G4730 Sleep apnea, unspecified: Secondary | ICD-10-CM | POA: Diagnosis not present

## 2022-12-29 DIAGNOSIS — Z79899 Other long term (current) drug therapy: Secondary | ICD-10-CM | POA: Diagnosis not present

## 2022-12-29 DIAGNOSIS — I10 Essential (primary) hypertension: Secondary | ICD-10-CM | POA: Diagnosis not present

## 2022-12-29 LAB — CBC WITH DIFFERENTIAL/PLATELET
Abs Immature Granulocytes: 0.01 10*3/uL (ref 0.00–0.07)
Basophils Absolute: 0 10*3/uL (ref 0.0–0.1)
Basophils Relative: 1 %
Eosinophils Absolute: 0.1 10*3/uL (ref 0.0–0.5)
Eosinophils Relative: 4 %
HCT: 47.8 % (ref 39.0–52.0)
Hemoglobin: 16.9 g/dL (ref 13.0–17.0)
Immature Granulocytes: 0 %
Lymphocytes Relative: 25 %
Lymphs Abs: 0.9 10*3/uL (ref 0.7–4.0)
MCH: 33.9 pg (ref 26.0–34.0)
MCHC: 35.4 g/dL (ref 30.0–36.0)
MCV: 95.8 fL (ref 80.0–100.0)
Monocytes Absolute: 0.4 10*3/uL (ref 0.1–1.0)
Monocytes Relative: 12 %
Neutro Abs: 2.1 10*3/uL (ref 1.7–7.7)
Neutrophils Relative %: 58 %
Platelets: 116 10*3/uL — ABNORMAL LOW (ref 150–400)
RBC: 4.99 MIL/uL (ref 4.22–5.81)
RDW: 12.7 % (ref 11.5–15.5)
WBC: 3.5 10*3/uL — ABNORMAL LOW (ref 4.0–10.5)
nRBC: 0 % (ref 0.0–0.2)

## 2022-12-29 LAB — FOLATE: Folate: 10.4 ng/mL (ref >5.9)

## 2022-12-29 LAB — PROTIME-INR
INR: 1 (ref 0.8–1.2)
Prothrombin Time: 13.4 s (ref 11.4–15.2)

## 2022-12-29 LAB — VITAMIN B12: Vitamin B-12: 180 pg/mL (ref 180–914)

## 2022-12-29 LAB — APTT: aPTT: 27 s (ref 24–36)

## 2022-12-29 NOTE — Addendum Note (Signed)
Addended by: Geanie Berlin on: 12/29/2022 12:00 PM   Modules accepted: Orders

## 2022-12-30 LAB — SURGICAL PATHOLOGY

## 2022-12-31 ENCOUNTER — Other Ambulatory Visit: Payer: Self-pay

## 2022-12-31 ENCOUNTER — Telehealth: Payer: Self-pay

## 2022-12-31 ENCOUNTER — Other Ambulatory Visit: Payer: Self-pay | Admitting: Adult Health

## 2022-12-31 DIAGNOSIS — R790 Abnormal level of blood mineral: Secondary | ICD-10-CM

## 2022-12-31 DIAGNOSIS — D696 Thrombocytopenia, unspecified: Secondary | ICD-10-CM

## 2022-12-31 DIAGNOSIS — E538 Deficiency of other specified B group vitamins: Secondary | ICD-10-CM

## 2022-12-31 LAB — COPPER, SERUM: Copper: 68 ug/dL — ABNORMAL LOW (ref 69–132)

## 2022-12-31 LAB — FLOW CYTOMETRY

## 2022-12-31 NOTE — Progress Notes (Signed)
Labs ordered to be completed in about 6 months, 5-7 days before visit

## 2022-12-31 NOTE — Telephone Encounter (Signed)
-----   Message from Raymond Johnson sent at 12/31/2022  3:05 PM EDT ----- Angelica Chessman would you check in next week if he got the message? Since b12 and copper are borderline, any vitamin brand contains them like [employer] Made for example should have more than enough daily value. Thanks.

## 2022-12-31 NOTE — Telephone Encounter (Signed)
TC to patient to inform of the lab results and information that Dr. Cherly Hensen requested in staff message. Pt verbalizes understanding and states he will take a multivitamin for men over 59 years of age.

## 2022-12-31 NOTE — Telephone Encounter (Signed)
Per staff message sent on 10/10 left patient a message in regards to scheduled follow up visits with appointment times/dates also left callback number if needed for rescheduling

## 2023-01-04 LAB — CBC WITH DIFFERENTIAL (CANCER CENTER ONLY)

## 2023-01-05 ENCOUNTER — Other Ambulatory Visit: Payer: Self-pay | Admitting: Adult Health

## 2023-01-05 DIAGNOSIS — I10 Essential (primary) hypertension: Secondary | ICD-10-CM

## 2023-01-05 DIAGNOSIS — F4322 Adjustment disorder with anxiety: Secondary | ICD-10-CM

## 2023-03-16 ENCOUNTER — Other Ambulatory Visit: Payer: Self-pay | Admitting: Adult Health

## 2023-03-16 ENCOUNTER — Encounter: Payer: Self-pay | Admitting: Adult Health

## 2023-03-16 DIAGNOSIS — E782 Mixed hyperlipidemia: Secondary | ICD-10-CM

## 2023-03-16 MED ORDER — OMEGA-3-ACID ETHYL ESTERS 1 G PO CAPS: 1.0000 | ORAL_CAPSULE | Freq: Three times a day (TID) | ORAL | 0 refills | Status: DC

## 2023-04-08 ENCOUNTER — Other Ambulatory Visit: Payer: Self-pay | Admitting: Adult Health

## 2023-04-08 DIAGNOSIS — F4322 Adjustment disorder with anxiety: Secondary | ICD-10-CM

## 2023-04-08 DIAGNOSIS — G4700 Insomnia, unspecified: Secondary | ICD-10-CM

## 2023-04-10 ENCOUNTER — Other Ambulatory Visit: Payer: Self-pay | Admitting: Adult Health

## 2023-04-26 ENCOUNTER — Encounter: Payer: Self-pay | Admitting: Adult Health

## 2023-04-27 ENCOUNTER — Other Ambulatory Visit: Payer: Self-pay | Admitting: Adult Health

## 2023-04-27 DIAGNOSIS — F419 Anxiety disorder, unspecified: Secondary | ICD-10-CM

## 2023-04-27 MED ORDER — CLONAZEPAM 0.5 MG PO TABS: 0.5000 mg | ORAL_TABLET | Freq: Two times a day (BID) | ORAL | 2 refills | Status: DC | PRN

## 2023-05-11 ENCOUNTER — Telehealth: Payer: 59

## 2023-05-11 DIAGNOSIS — J019 Acute sinusitis, unspecified: Secondary | ICD-10-CM

## 2023-05-11 MED ORDER — AMOXICILLIN-POT CLAVULANATE 875-125 MG PO TABS
1.0000 | ORAL_TABLET | Freq: Two times a day (BID) | ORAL | 0 refills | Status: DC
Start: 2023-05-11 — End: 2024-01-18

## 2023-05-11 NOTE — Progress Notes (Signed)
 Virtual Visit Consent   Raymond Johnson, you are scheduled for a virtual visit with a [facility] provider today. Just as with appointments in the office, your consent must be obtained to participate. Your consent will be active for this visit and any virtual visit you may have with one of our providers in the next 365 days. If you have a MyChart account, a copy of this consent can be sent to you electronically.  As this is a virtual visit, video technology does not allow for your provider to perform a traditional examination. This may limit your provider's ability to fully assess your condition. If your provider identifies any concerns that need to be evaluated in person or the need to arrange testing (such as labs, EKG, etc.), we will make arrangements to do so. Although advances in technology are sophisticated, we cannot ensure that it will always work on either your end or our end. If the connection with a video visit is poor, the visit may have to be switched to a telephone visit. With either a video or telephone visit, we are not always able to ensure that we have a secure connection.  By engaging in this virtual visit, you consent to the provision of healthcare and authorize for your insurance to be billed (if applicable) for the services provided during this visit. Depending on your insurance coverage, you may receive a charge related to this service.  I need to obtain your verbal consent now. Are you willing to proceed with your visit today? Raymond Johnson has provided verbal consent on 05/11/2023 for a virtual visit (video or telephone). Piedad Climes, [place]  Date: 05/11/2023 8:15 AM   Virtual Visit via Video Note   I, Piedad Climes, connected with  Raymond Johnson  ([id number], 02-14-1966) on 05/11/23 at  7:45 AM EST by a video-enabled telemedicine application and verified that I am speaking with the correct person using two identifiers.  Location: Patient: Virtual  Visit Location Patient: Home Provider: Virtual Visit Location Provider: Home Office   I discussed the limitations of evaluation and management by telemedicine and the availability of in person appointments. The patient expressed understanding and agreed to proceed.    History of Present Illness: Raymond Johnson is a 58 y.o. who identifies as a male who was assigned male at birth, and is being seen today for URI symptoms starting last week and worsening since Sunday. Notes initially with some dry cough, nasal congestion and post-nasal drip. Moving to more head congestion and some thick green nasal discharge with sinus pressure. Denies fever, chills. Does not recent travel to [place]. Denies known sick contact. Has not tested for COVID.    OTC -- Tylenol AM/PM, Advil  HPI: HPI  Problems:  Patient Active Problem List   Diagnosis Date Noted   Fatty liver 08/06/2022   Cervical radiculopathy 12/08/2021   History of basal cell cancer 06/30/2021   Insomnia 08/29/2019   OSA (obstructive sleep apnea) 11/16/2018   Difficulty controlling anger 06/17/2015   Sleep disturbance 06/17/2015   Stasis dermatitis of both legs 12/24/2014   Seasonal allergic rhinitis 12/13/2013   Adjustment disorder with anxious mood 06/01/2013   ED (erectile dysfunction) 06/01/2013   Essential hypertension 06/01/2013   Mixed hyperlipidemia 06/01/2013   Recurrent headache 06/01/2013    Allergies: No Known Allergies Medications:  Current Outpatient Medications:    amoxicillin-clavulanate (AUGMENTIN) 875-125 MG tablet, Take 1 tablet by mouth 2 (two) times daily., Disp: 14 tablet, Rfl:  0   amLODipine-benazepril (LOTREL) 5-20 MG capsule, TAKE ONE CAPSULE BY MOUTH DAILY, Disp: 90 capsule, Rfl: 1   atorvastatin (LIPITOR) 10 MG tablet, Take 1 tablet (10 mg total) by mouth daily., Disp: 90 tablet, Rfl: 3   Brimonidine Tartrate (MIRVASO) 0.33 % GEL, Apply topically., Disp: , Rfl:    clonazePAM (KLONOPIN) 0.5 MG tablet, Take 1  tablet (0.5 mg total) by mouth 2 (two) times daily as needed for anxiety., Disp: 20 tablet, Rfl: 2   doxycycline (ORACEA) 40 MG capsule, Take 40 mg by mouth daily., Disp: , Rfl:    fexofenadine (ALLEGRA) 180 MG tablet, Take 1 tablet (180 mg total) by mouth daily., Disp: 30 tablet, Rfl: 0   fluticasone (FLONASE) 50 MCG/ACT nasal spray, SPRAY 1 SPRAY IN EACH NOSTRIL ONCE DAILY, Disp: 16 mL, Rfl: 0   mirtazapine (REMERON) 7.5 MG tablet, TAKE 1 TABLET BY MOUTH AT BEDTIME, Disp: 90 tablet, Rfl: 1   omega-3 acid ethyl esters (LOVAZA) 1 g capsule, Take 1 capsule (1 g total) by mouth 3 (three) times daily., Disp: 270 capsule, Rfl: 0   omeprazole (PRILOSEC) 40 MG capsule, Take 1 capsule (40 mg total) by mouth daily., Disp: 90 capsule, Rfl: 2   Oxymetazoline HCl (RHOFADE) 1 % CREA, Apply topically., Disp: , Rfl:    sertraline (ZOLOFT) 100 MG tablet, TAKE 1 TABLET BY MOUTH DAILY, Disp: 90 tablet, Rfl: 1   tadalafil (CIALIS) 20 MG tablet, Take 1 tablet (20 mg total) by mouth daily as needed., Disp: 10 tablet, Rfl: 6   triamcinolone cream (KENALOG) 0.1 %, Apply topically., Disp: , Rfl:   Observations/Objective: Patient is well-developed, well-nourished in no acute distress.  Resting comfortably  at home.  Head is normocephalic, atraumatic.  No labored breathing.  Speech is clear and coherent with logical content.  Patient is alert and oriented at baseline.   Assessment and Plan: 1. Acute non-recurrent sinusitis, unspecified location (Primary)  Will have him COVID test as a precaution giving recent travel. Discussed viral versus bacterial etiology. If COVID negative, will have him start Augmentin. Supportive measures and OTC medications reviewed. Work note to be provided.   Follow Up Instructions: I discussed the assessment and treatment plan with the patient. The patient was provided an opportunity to ask questions and all were answered. The patient agreed with the plan and demonstrated an understanding  of the instructions.  A copy of instructions were sent to the patient via MyChart unless otherwise noted below.   The patient was advised to call back or seek an in-person evaluation if the symptoms worsen or if the condition fails to improve as anticipated.    Piedad Climes, PA-C

## 2023-05-11 NOTE — Patient Instructions (Addendum)
 Raymond Johnson, thank you for joining Piedad Climes, PA-C for today's virtual visit.  While this provider is not your primary care provider (PCP), if your PCP is located in our provider database this encounter information will be shared with them immediately following your visit.   A [facility] MyChart account gives you access to today's visit and all your visits, tests, and labs performed at [facility] " click here if you don't have a [facility] MyChart account or go to [email or web address]  Consent: (Patient) Raymond Johnson provided verbal consent for this virtual visit at the beginning of the encounter.  Current Medications:  Current Outpatient Medications:    amLODipine-benazepril (LOTREL) 5-20 MG capsule, TAKE ONE CAPSULE BY MOUTH DAILY, Disp: 90 capsule, Rfl: 1   atorvastatin (LIPITOR) 10 MG tablet, Take 1 tablet (10 mg total) by mouth daily., Disp: 90 tablet, Rfl: 3   Brimonidine Tartrate (MIRVASO) 0.33 % GEL, Apply topically., Disp: , Rfl:    clonazePAM (KLONOPIN) 0.5 MG tablet, Take 1 tablet (0.5 mg total) by mouth 2 (two) times daily as needed for anxiety., Disp: 20 tablet, Rfl: 2   doxycycline (ORACEA) 40 MG capsule, Take 40 mg by mouth daily., Disp: , Rfl:    fexofenadine (ALLEGRA) 180 MG tablet, Take 1 tablet (180 mg total) by mouth daily., Disp: 30 tablet, Rfl: 0   fluticasone (FLONASE) 50 MCG/ACT nasal spray, SPRAY 1 SPRAY IN EACH NOSTRIL ONCE DAILY, Disp: 16 mL, Rfl: 0   mirtazapine (REMERON) 7.5 MG tablet, TAKE 1 TABLET BY MOUTH AT BEDTIME, Disp: 90 tablet, Rfl: 1   omega-3 acid ethyl esters (LOVAZA) 1 g capsule, Take 1 capsule (1 g total) by mouth 3 (three) times daily., Disp: 270 capsule, Rfl: 0   omeprazole (PRILOSEC) 40 MG capsule, Take 1 capsule (40 mg total) by mouth daily., Disp: 90 capsule, Rfl: 2   Oxymetazoline HCl (RHOFADE) 1 % CREA, Apply topically., Disp: , Rfl:    sertraline (ZOLOFT) 100 MG tablet, TAKE 1 TABLET BY MOUTH DAILY,  Disp: 90 tablet, Rfl: 1   tadalafil (CIALIS) 20 MG tablet, Take 1 tablet (20 mg total) by mouth daily as needed., Disp: 10 tablet, Rfl: 6   triamcinolone cream (KENALOG) 0.1 %, Apply topically., Disp: , Rfl:    Medications ordered in this encounter:  No orders of the defined types were placed in this encounter.    *If you need refills on other medications prior to your next appointment, please contact your pharmacy*  Follow-Up: Call back or seek an in-person evaluation if the symptoms worsen or if the condition fails to improve as anticipated.  [facility] [phone]  Other Instructions COVID test as discussed. Message [place] immediately if positive.   Otherwise, Please take antibiotic as directed.  Increase fluid intake.  Use Saline nasal spray.  Take a daily multivitamin. Continue your Flonase. Start the Mucinex OTC. Ok to continue your current OTC medications.  Place a humidifier in the bedroom.  Please call or return clinic if symptoms are not improving.  Sinusitis Sinusitis is redness, soreness, and swelling (inflammation) of the paranasal sinuses. Paranasal sinuses are air pockets within the bones of your face (beneath the eyes, the middle of the forehead, or above the eyes). In healthy paranasal sinuses, mucus is able to drain out, and air is able to circulate through them by way of your nose. However, when your paranasal sinuses are inflamed, mucus and air can become trapped. This can allow bacteria and  other germs to grow and cause infection. Sinusitis can develop quickly and last only a short time (acute) or continue over a long period (chronic). Sinusitis that lasts for more than 12 weeks is considered chronic.  CAUSES  Causes of sinusitis include: Allergies. Structural abnormalities, such as displacement of the cartilage that separates your nostrils (deviated septum), which can decrease the air flow through your nose and sinuses and affect sinus  drainage. Functional abnormalities, such as when the small hairs (cilia) that line your sinuses and help remove mucus do not work properly or are not present. SYMPTOMS  Symptoms of acute and chronic sinusitis are the same. The primary symptoms are pain and pressure around the affected sinuses. Other symptoms include: Upper toothache. Earache. Headache. Bad breath. Decreased sense of smell and taste. A cough, which worsens when you are lying flat. Fatigue. Fever. Thick drainage from your nose, which often is green and may contain pus (purulent). Swelling and warmth over the affected sinuses. DIAGNOSIS  Your caregiver will perform a physical exam. During the exam, your caregiver may: Look in your nose for signs of abnormal growths in your nostrils (nasal polyps). Tap over the affected sinus to check for signs of infection. View the inside of your sinuses (endoscopy) with a special imaging device with a light attached (endoscope), which is inserted into your sinuses. If your caregiver suspects that you have chronic sinusitis, one or more of the following tests may be recommended: Allergy tests. Nasal culture A sample of mucus is taken from your nose and sent to a lab and screened for bacteria. Nasal cytology A sample of mucus is taken from your nose and examined by your caregiver to determine if your sinusitis is related to an allergy. TREATMENT  Most cases of acute sinusitis are related to a viral infection and will resolve on their own within 10 days. Sometimes medicines are prescribed to help relieve symptoms (pain medicine, decongestants, nasal steroid sprays, or saline sprays).  However, for sinusitis related to a bacterial infection, your caregiver will prescribe antibiotic medicines. These are medicines that will help kill the bacteria causing the infection.  Rarely, sinusitis is caused by a fungal infection. In theses cases, your caregiver will prescribe antifungal medicine. For some  cases of chronic sinusitis, surgery is needed. Generally, these are cases in which sinusitis recurs more than 3 times per year, despite other treatments. HOME CARE INSTRUCTIONS  Drink plenty of water. Water helps thin the mucus so your sinuses can drain more easily. Use a humidifier. Inhale steam 3 to 4 times a day (for example, sit in the bathroom with the shower running). Apply a warm, moist washcloth to your face 3 to 4 times a day, or as directed by your caregiver. Use saline nasal sprays to help moisten and clean your sinuses. Take over-the-counter or prescription medicines for pain, discomfort, or fever only as directed by your caregiver. SEEK IMMEDIATE MEDICAL CARE IF: You have increasing pain or severe headaches. You have nausea, vomiting, or drowsiness. You have swelling around your face. You have vision problems. You have a stiff neck. You have difficulty breathing. MAKE SURE YOU:  Understand these instructions. Will watch your condition. Will get help right away if you are not doing well or get worse. Document Released: 03/09/2005 Document Revised: 06/01/2011 Document Reviewed: 03/24/2011 [employer] [facility] [employer] Patient Information 2014 [place], [place].    If you have been instructed to have an in-person evaluation today at a local Urgent Care facility, please use the link below. It  will take you to a list of all of our available [facility], including address, phone number and hours of operation. Please do not delay care.  [facility]  If you or a family member do not have a primary care provider, use the link below to schedule a visit and establish care. When you choose a [facility] primary care physician or advanced practice provider, you gain a long-term partner in health. Find a Primary Care Provider  Learn more about [facility] in-office and virtual care options: [facility] - Get Care Now

## 2023-06-02 ENCOUNTER — Other Ambulatory Visit: Payer: Self-pay | Admitting: Gastroenterology

## 2023-06-13 ENCOUNTER — Other Ambulatory Visit: Payer: Self-pay | Admitting: Adult Health

## 2023-06-13 DIAGNOSIS — E782 Mixed hyperlipidemia: Secondary | ICD-10-CM

## 2023-06-14 ENCOUNTER — Encounter: Payer: Self-pay | Admitting: Adult Health

## 2023-06-15 ENCOUNTER — Telehealth: Payer: Self-pay

## 2023-06-15 NOTE — Telephone Encounter (Signed)
 Completed - Patient cancelled appts. Patient stated he will call back to reschedule.

## 2023-06-15 NOTE — Telephone Encounter (Signed)
 Ok to send in Omega 3?

## 2023-06-15 NOTE — Telephone Encounter (Signed)
 done

## 2023-06-17 ENCOUNTER — Other Ambulatory Visit: Payer: Self-pay | Admitting: Adult Health

## 2023-06-17 ENCOUNTER — Encounter: Payer: Self-pay | Admitting: Adult Health

## 2023-06-17 MED ORDER — FLUTICASONE PROPIONATE 50 MCG/ACT NA SUSP: 2.0000 | Freq: Every day | NASAL | 0 refills | Status: DC

## 2023-06-28 ENCOUNTER — Other Ambulatory Visit: Payer: 59

## 2023-07-01 ENCOUNTER — Ambulatory Visit: Payer: 59

## 2023-07-05 ENCOUNTER — Other Ambulatory Visit: Payer: Self-pay | Admitting: Adult Health

## 2023-07-05 DIAGNOSIS — I10 Essential (primary) hypertension: Secondary | ICD-10-CM

## 2023-07-24 ENCOUNTER — Other Ambulatory Visit: Payer: Self-pay | Admitting: Adult Health

## 2023-07-24 DIAGNOSIS — E782 Mixed hyperlipidemia: Secondary | ICD-10-CM

## 2023-07-26 ENCOUNTER — Other Ambulatory Visit: Payer: Self-pay | Admitting: Adult Health

## 2023-07-26 DIAGNOSIS — F4322 Adjustment disorder with anxiety: Secondary | ICD-10-CM

## 2023-09-10 ENCOUNTER — Other Ambulatory Visit: Payer: Self-pay | Admitting: Adult Health

## 2023-10-14 ENCOUNTER — Other Ambulatory Visit: Payer: Self-pay | Admitting: Adult Health

## 2023-10-14 DIAGNOSIS — F419 Anxiety disorder, unspecified: Secondary | ICD-10-CM

## 2023-10-15 NOTE — Telephone Encounter (Signed)
 Okay for refill?

## 2023-10-29 ENCOUNTER — Encounter: Payer: Self-pay | Admitting: Adult Health

## 2023-10-29 ENCOUNTER — Other Ambulatory Visit: Payer: Self-pay | Admitting: Adult Health

## 2023-10-29 MED ORDER — TRAZODONE HCL 50 MG PO TABS: 25.0000 mg | ORAL_TABLET | Freq: Every evening | ORAL | 0 refills | Status: DC | PRN

## 2023-10-29 NOTE — Telephone Encounter (Signed)
**Note De-identified  [place] Obfuscation** Please advise 

## 2023-11-05 ENCOUNTER — Other Ambulatory Visit: Payer: Self-pay | Admitting: Adult Health

## 2023-11-05 DIAGNOSIS — G4700 Insomnia, unspecified: Secondary | ICD-10-CM

## 2023-11-05 DIAGNOSIS — F4322 Adjustment disorder with anxiety: Secondary | ICD-10-CM

## 2023-11-10 NOTE — Telephone Encounter (Signed)
  The original prescription was discontinued on 10/29/2023 by Merna Huxley, NP. Renewing this prescription may not be appropriate.

## 2023-11-30 ENCOUNTER — Other Ambulatory Visit: Payer: Self-pay | Admitting: Adult Health

## 2023-11-30 DIAGNOSIS — E782 Mixed hyperlipidemia: Secondary | ICD-10-CM

## 2023-12-05 ENCOUNTER — Other Ambulatory Visit: Payer: Self-pay | Admitting: Adult Health

## 2023-12-05 DIAGNOSIS — E782 Mixed hyperlipidemia: Secondary | ICD-10-CM

## 2023-12-08 NOTE — Telephone Encounter (Signed)
Patient need to schedule cpe  for more refills.

## 2023-12-14 ENCOUNTER — Other Ambulatory Visit: Payer: Self-pay | Admitting: Adult Health

## 2023-12-14 DIAGNOSIS — E782 Mixed hyperlipidemia: Secondary | ICD-10-CM

## 2023-12-18 ENCOUNTER — Encounter: Payer: Self-pay | Admitting: Adult Health

## 2023-12-18 ENCOUNTER — Other Ambulatory Visit: Payer: Self-pay | Admitting: Adult Health

## 2023-12-18 DIAGNOSIS — E782 Mixed hyperlipidemia: Secondary | ICD-10-CM

## 2023-12-21 MED ORDER — ATORVASTATIN CALCIUM 10 MG PO TABS: 10.0000 mg | ORAL_TABLET | Freq: Every day | ORAL | 0 refills | Status: DC

## 2023-12-28 ENCOUNTER — Other Ambulatory Visit: Payer: Self-pay | Admitting: Adult Health

## 2023-12-28 DIAGNOSIS — E782 Mixed hyperlipidemia: Secondary | ICD-10-CM

## 2024-01-04 ENCOUNTER — Other Ambulatory Visit: Payer: Self-pay | Admitting: Adult Health

## 2024-01-04 DIAGNOSIS — I10 Essential (primary) hypertension: Secondary | ICD-10-CM

## 2024-01-17 ENCOUNTER — Other Ambulatory Visit: Payer: Self-pay | Admitting: Adult Health

## 2024-01-17 DIAGNOSIS — E782 Mixed hyperlipidemia: Secondary | ICD-10-CM

## 2024-01-18 ENCOUNTER — Encounter: Admitting: Adult Health

## 2024-01-18 ENCOUNTER — Encounter: Payer: Self-pay | Admitting: Adult Health

## 2024-01-18 VITALS — BP 120/80 | HR 88 | Temp 98.1°F | Ht 68.5 in | Wt 184.0 lb

## 2024-01-18 NOTE — Progress Notes (Deleted)
 Subjective:    Patient ID: Raymond Johnson, male    DOB: [birth year], 58 y.o.   MRN: [id number]  HPI Patient presents for yearly preventative medicine examination. He is a pleasant 58 year old male who  has a past medical history of Allergy, Anxiety, Chronic headaches, Diverticulitis, ED (erectile dysfunction), Elevated liver enzymes, Fatty liver, GERD (gastroesophageal reflux disease), Gout, History of colonic polyps, History of kidney stones, HLD (hyperlipidemia), Hypertension, Seasonal allergies, and Sleep apnea.  Hypertension-managed with Lotrel 5-20 mg daily.  He denies dizziness, lightheadedness, chest pain, or shortness of breath BP Readings from Last 3 Encounters:  12/29/22 (!) 145/89  11/26/22 120/80  09/18/22 132/82   Anxiety-prescribed Zoloft  100 mg.    Insomnia-takes Remeron  7.5 mg daily.  He does sleep well on this dose.  Denies side effects such as groggy feeling when he wakes up, increased appetite, or weight gain  Hyperlipidemia-managed with Lipitor 10 mg daily and Lovaza  1 g 3 times daily. He is interested in Calcium  Scoring CT  Lab Results  Component Value Date   CHOL 165 11/26/2022   HDL 75.50 11/26/2022   LDLCALC 69 11/26/2022   TRIG 103.0 11/26/2022   CHOLHDL 2 11/26/2022    ED - prescribed cialis  20 mg PRN   GERD -managed with Prilosec 40 mg daily and his symptoms are controlled.  His last endoscopy and 07/2020 showed LA grade D reflux esophagitis with no bleeding.  OSA -uses an oral appliance and reports that he feels well rested when he wakes up    All immunizations and health maintenance protocols were reviewed with the patient and needed orders were placed.  Appropriate screening laboratory values were ordered for the patient including screening of hyperlipidemia, renal function and hepatic function. If indicated by BPH, a PSA was ordered.  Medication reconciliation,  past medical history, social history, problem list and allergies were reviewed in  detail with the patient  Goals were established with regard to weight loss, exercise, and  diet in compliance with medications  End of life planning was discussed.    Review of Systems  Constitutional: Negative.   HENT: Negative.    Eyes: Negative.   Respiratory: Negative.    Cardiovascular: Negative.   Gastrointestinal: Negative.   Endocrine: Negative.   Genitourinary: Negative.   Musculoskeletal: Negative.   Skin: Negative.   Allergic/Immunologic: Negative.   Neurological: Negative.   Hematological: Negative.   Psychiatric/Behavioral: Negative.    All other systems reviewed and are negative.  Past Medical History:  Diagnosis Date   Allergy    Anxiety    Chronic headaches    stress headaches   Diverticulitis    ED (erectile dysfunction)    Elevated liver enzymes    history being elevated   Fatty liver    GERD (gastroesophageal reflux disease)    Gout    >10 years from last episode   History of colonic polyps    History of kidney stones    HLD (hyperlipidemia)    Hypertension    Seasonal allergies    Sleep apnea    not using CPAP    Social History   Socioeconomic History   Marital status: Single    Spouse name: Not on file   Number of children: 3   Years of education: Not on file   Highest education level: Not on file  Occupational History   Occupation: Sales  Tobacco Use   Smoking status: Former    Current packs/day: 0.00  Average packs/day: 0.3 packs/day for 31.0 years (7.8 ttl pk-yrs)    Types: Cigarettes    Start date: 34    Quit date: 2014    Years since quitting: 11.8   Smokeless tobacco: Never   Tobacco comments:    quit from age 70 for 4 years   Vaping Use   Vaping status: Never Used  Substance and Sexual Activity   Alcohol use: Yes    Comment: socially   Drug use: No   Sexual activity: Not on file  Other Topics Concern   Not on file  Social History Narrative   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       Not married - Divorced      Three children - 2 live locally, one in [place]       Social Drivers of Health   Financial Resource Strain: Not on file  Food Insecurity: Not on file  Transportation Needs: Not on file  Physical Activity: Not on file  Stress: Not on file  Social Connections: Unknown (09/04/2022)   [facility] from [facility]   Social Network    Social Network: Not on file  Intimate Partner Violence: Unknown (09/04/2022)   Received from [facility]   HITS    Physically Hurt: Not on file    Insult or Talk Down To: Not on file    Threaten Physical Harm: Not on file    Scream or Curse: Not on file    Past Surgical History:  Procedure Laterality Date   COLONOSCOPY  2016   [facility]    COLONOSCOPY     EXTRACORPOREAL SHOCK WAVE LITHOTRIPSY Left 01/28/2017   Procedure: LEFT EXTRACORPOREAL SHOCK WAVE LITHOTRIPSY (ESWL);  Surgeon: Watt Rush, MD;  Location: [facility];  Service: Urology;  Laterality: Left;   HERNIA REPAIR     at age 1 years   POLYPECTOMY     skin cancer      not cancer- benign   UPPER GASTROINTESTINAL ENDOSCOPY      Family History  Problem Relation Age of Onset   Breast cancer Mother    Lung cancer Father    Prostate cancer Brother 26   Cancer Paternal Grandfather        ? colon    Colon cancer Neg Hx    Esophageal cancer Neg Hx    Rectal cancer Neg Hx    Stomach cancer Neg Hx    Colon polyps Neg Hx     No Known Allergies  Current Outpatient Medications on File Prior to Visit  Medication Sig Dispense Refill   Acidophilus Lactobacillus CAPS Take by mouth.     amLODipine -benazepril  (LOTREL) 5-20 MG capsule TAKE 1 CAPSULE BY MOUTH DAILY 90 capsule 3   atorvastatin  (LIPITOR) 10 MG tablet TAKE 1 TABLET BY MOUTH DAILY 30 tablet 0   clonazePAM  (KLONOPIN ) 0.5 MG tablet TAKE 1 TABLET BY MOUTH 2 TIMES A DAY AS NEEDED FOR ANXIETY 20 tablet 2   doxycycline (ORACEA) 40 MG capsule Take 40 mg by mouth daily.     fluticasone  (FLONASE ) 50 MCG/ACT  nasal spray SPRAY 2 SPRAYS IN EACH NOSTRIL ONCE DAILY 16 mL 0   ipratropium (ATROVENT) 0.06 % nasal spray as needed.     omega-3 acid ethyl esters (LOVAZA ) 1 g capsule TAKE 1 CAPSULE BY MOUTH 3 TIMES A DAY 90 capsule 0   omeprazole  (PRILOSEC) 40 MG capsule TAKE ONE CAPSULE BY MOUTH DAILY 90 capsule 2   Oxymetazoline  HCl (RHOFADE) 1 % CREA Apply topically.     sertraline  (ZOLOFT ) 100 MG tablet TAKE 1 TABLET BY MOUTH DAILY 90 tablet 1   tadalafil  (CIALIS ) 20 MG tablet TAKE 1 TABLET BY MOUTH DAILY AS NEEDED 10 tablet 6   triamcinolone cream (KENALOG) 0.1 % Apply topically.     Brimonidine Tartrate (MIRVASO) 0.33 % GEL Apply topically.     fexofenadine  (ALLEGRA ) 180 MG tablet Take 1 tablet (180 mg total) by mouth daily. 30 tablet 0   No current facility-administered medications on file prior to visit.    There were no vitals taken for this visit.      Objective:   Physical Exam Vitals and nursing note reviewed.  Constitutional:      General: He is not in acute distress.    Appearance: Normal appearance. He is not ill-appearing.  HENT:     Head: Normocephalic and atraumatic.     Right Ear: Tympanic membrane, ear canal and external ear normal. There is no impacted cerumen.     Left Ear: Tympanic membrane, ear canal and external ear normal. There is no impacted cerumen.     Nose: Nose normal. No congestion or rhinorrhea.     Mouth/Throat:     Mouth: Mucous membranes are moist.     Pharynx: Oropharynx is clear.  Eyes:     Extraocular Movements: Extraocular movements intact.     Conjunctiva/sclera: Conjunctivae normal.     Pupils: Pupils are equal, round, and reactive to light.  Neck:     Vascular: No carotid bruit.  Cardiovascular:     Rate and Rhythm: Normal rate and regular rhythm.     Pulses: Normal pulses.     Heart sounds: No murmur heard.    No friction rub. No gallop.  Pulmonary:     Effort: Pulmonary effort is normal.     Breath sounds: Normal breath sounds.  Abdominal:      General: Abdomen is flat. Bowel sounds are normal. There is no distension.     Palpations: Abdomen is soft. There is no mass.     Tenderness: There is no abdominal tenderness. There is no guarding or rebound.     Hernia: No hernia is present.  Musculoskeletal:        General: Normal range of motion.     Cervical back: Normal range of motion and neck supple.  Lymphadenopathy:     Cervical: No cervical adenopathy.  Skin:    General: Skin is warm and dry.     Capillary Refill: Capillary refill takes less than 2 seconds.  Neurological:     General: No focal deficit present.     Mental Status: He is alert and oriented to person, place, and time.  Psychiatric:        Mood and Affect: Mood normal.        Behavior: Behavior normal.        Thought Content: Thought content normal.        Judgment: Judgment normal.       Assessment & Plan:

## 2024-01-25 ENCOUNTER — Other Ambulatory Visit: Payer: Self-pay | Admitting: Adult Health

## 2024-01-25 DIAGNOSIS — E782 Mixed hyperlipidemia: Secondary | ICD-10-CM

## 2024-01-25 NOTE — Progress Notes (Signed)
 Entered chart in error.

## 2024-01-25 NOTE — Telephone Encounter (Signed)
  The original prescription was discontinued on 01/18/2024 by Merna Huxley, NP. Renewing this prescription may not be appropriate.

## 2024-02-08 ENCOUNTER — Other Ambulatory Visit: Payer: Self-pay | Admitting: Adult Health

## 2024-02-08 DIAGNOSIS — F4322 Adjustment disorder with anxiety: Secondary | ICD-10-CM

## 2024-02-14 ENCOUNTER — Other Ambulatory Visit: Payer: Self-pay | Admitting: Adult Health

## 2024-02-14 DIAGNOSIS — E782 Mixed hyperlipidemia: Secondary | ICD-10-CM

## 2024-02-19 ENCOUNTER — Other Ambulatory Visit: Payer: Self-pay | Admitting: Adult Health

## 2024-02-19 DIAGNOSIS — E782 Mixed hyperlipidemia: Secondary | ICD-10-CM

## 2024-02-26 ENCOUNTER — Other Ambulatory Visit: Payer: Self-pay | Admitting: Adult Health

## 2024-02-26 ENCOUNTER — Other Ambulatory Visit: Payer: Self-pay | Admitting: Gastroenterology

## 2024-02-26 DIAGNOSIS — E782 Mixed hyperlipidemia: Secondary | ICD-10-CM

## 2024-03-13 ENCOUNTER — Telehealth: Payer: Self-pay | Admitting: Gastroenterology

## 2024-03-13 NOTE — Telephone Encounter (Signed)
 I am currently unable to accommodate telemedicine visits in my clinic schedule.  However, I am happy to help if possible.  Last I saw him in May 2024 he was doing well on once daily omeprazole .  Please refill it for 90-day supply.  If he has that medicine managed by primary care going forward (and is referred back to us  if needed for breakthrough symptoms or other new issues), then he would not need to regularly see me just for the purposes of getting a prescription.  VEAR Brand MD

## 2024-03-13 NOTE — Telephone Encounter (Signed)
 Good afternoon Dr. Legrand  The following patient needs to have an appointment for refills. He stated that he does not want to come in the office for this and is asking if it can be done virtually. Please advise.

## 2024-03-17 MED ORDER — OMEPRAZOLE 40 MG PO CPDR: 40.0000 mg | DELAYED_RELEASE_CAPSULE | Freq: Every day | ORAL | 0 refills | Status: AC

## 2024-03-17 NOTE — Telephone Encounter (Signed)
 Attempted to reach patient.  Refill for Omeprazole  sent per Dr Legrand' order.  Sent mychart with copy of Dr Legrand' message. Advised patient to call back if he had any additional questions.

## 2024-03-17 NOTE — Telephone Encounter (Signed)
 Inbound call from patient requesting follow up on medication refill.  Requesting refill of 90 day supply be sent. Please advise.

## 2024-04-03 ENCOUNTER — Other Ambulatory Visit: Payer: Self-pay | Admitting: Adult Health

## 2024-04-03 DIAGNOSIS — E782 Mixed hyperlipidemia: Secondary | ICD-10-CM

## 2024-04-04 NOTE — Telephone Encounter (Signed)
 Patient need to schedule CPE for more refills.

## 2024-04-10 ENCOUNTER — Other Ambulatory Visit: Payer: Self-pay | Admitting: Adult Health

## 2024-04-10 DIAGNOSIS — E782 Mixed hyperlipidemia: Secondary | ICD-10-CM
